# Patient Record
Sex: Female | Born: 1990 | Race: White | Hispanic: No | Marital: Single | Smoking: Former smoker
Health system: Southern US, Community
[De-identification: ages and names within clinical notes are randomized; demographics above are authoritative.]

## PROBLEM LIST (undated history)

## (undated) DIAGNOSIS — E079 Disorder of thyroid, unspecified: Secondary | ICD-10-CM

## (undated) DIAGNOSIS — I959 Hypotension, unspecified: Secondary | ICD-10-CM

## (undated) DIAGNOSIS — Q8740 Marfan's syndrome, unspecified: Secondary | ICD-10-CM

## (undated) DIAGNOSIS — Q796 Ehlers-Danlos syndrome, unspecified: Secondary | ICD-10-CM

## (undated) DIAGNOSIS — D649 Anemia, unspecified: Secondary | ICD-10-CM

## (undated) DIAGNOSIS — I340 Nonrheumatic mitral (valve) insufficiency: Secondary | ICD-10-CM

## (undated) DIAGNOSIS — F419 Anxiety disorder, unspecified: Secondary | ICD-10-CM

## (undated) DIAGNOSIS — F329 Major depressive disorder, single episode, unspecified: Secondary | ICD-10-CM

## (undated) DIAGNOSIS — F32A Depression, unspecified: Secondary | ICD-10-CM

## (undated) HISTORY — DX: Anemia, unspecified: D64.9

## (undated) HISTORY — PX: MOLE REMOVAL: SHX2046

## (undated) HISTORY — DX: Major depressive disorder, single episode, unspecified: F32.9

## (undated) HISTORY — DX: Ehlers-Danlos syndrome, unspecified: Q79.60

## (undated) HISTORY — DX: Hypotension, unspecified: I95.9

## (undated) HISTORY — DX: Depression, unspecified: F32.A

## (undated) HISTORY — DX: Nonrheumatic mitral (valve) insufficiency: I34.0

## (undated) HISTORY — DX: Anxiety disorder, unspecified: F41.9

---

## 2014-08-17 ENCOUNTER — Ambulatory Visit (INDEPENDENT_AMBULATORY_CARE_PROVIDER_SITE_OTHER): Payer: No Typology Code available for payment source

## 2014-08-17 ENCOUNTER — Ambulatory Visit (INDEPENDENT_AMBULATORY_CARE_PROVIDER_SITE_OTHER): Payer: No Typology Code available for payment source | Admitting: Family Medicine

## 2014-08-17 VITALS — BP 110/70 | HR 104 | Temp 98.3°F | Resp 18 | Ht 65.0 in | Wt 121.0 lb

## 2014-08-17 DIAGNOSIS — D649 Anemia, unspecified: Secondary | ICD-10-CM

## 2014-08-17 DIAGNOSIS — J0100 Acute maxillary sinusitis, unspecified: Secondary | ICD-10-CM

## 2014-08-17 DIAGNOSIS — R509 Fever, unspecified: Secondary | ICD-10-CM

## 2014-08-17 DIAGNOSIS — R059 Cough, unspecified: Secondary | ICD-10-CM

## 2014-08-17 DIAGNOSIS — H66003 Acute suppurative otitis media without spontaneous rupture of ear drum, bilateral: Secondary | ICD-10-CM

## 2014-08-17 DIAGNOSIS — R05 Cough: Secondary | ICD-10-CM

## 2014-08-17 LAB — POCT CBC
Granulocyte percent: 75.8 %G (ref 37–80)
HEMATOCRIT: 36.3 % — AB (ref 37.7–47.9)
Hemoglobin: 12 g/dL — AB (ref 12.2–16.2)
LYMPH, POC: 1.5 (ref 0.6–3.4)
MCH: 29.1 pg (ref 27–31.2)
MCHC: 33.1 g/dL (ref 31.8–35.4)
MCV: 87.7 fL (ref 80–97)
MID (cbc): 0.5 (ref 0–0.9)
MPV: 6.8 fL (ref 0–99.8)
POC Granulocyte: 6.3 (ref 2–6.9)
POC LYMPH PERCENT: 17.9 %L (ref 10–50)
POC MID %: 6.3 %M (ref 0–12)
Platelet Count, POC: 274 10*3/uL (ref 142–424)
RBC: 4.14 M/uL (ref 4.04–5.48)
RDW, POC: 13.1 %
WBC: 8.3 10*3/uL (ref 4.6–10.2)

## 2014-08-17 LAB — POCT URINE PREGNANCY: PREG TEST UR: NEGATIVE

## 2014-08-17 MED ORDER — FLUTICASONE PROPIONATE 50 MCG/ACT NA SUSP: 2.0000 | Freq: Every day | NASAL | Status: DC

## 2014-08-17 MED ORDER — CEFDINIR 300 MG PO CAPS: 600.0000 mg | ORAL_CAPSULE | Freq: Every day | ORAL | Status: DC

## 2014-08-17 MED ORDER — BENZONATATE 100 MG PO CAPS: 100.0000 mg | ORAL_CAPSULE | Freq: Three times a day (TID) | ORAL | Status: DC | PRN

## 2014-08-17 MED ORDER — HYDROCODONE-HOMATROPINE 5-1.5 MG/5ML PO SYRP: 5.0000 mL | ORAL_SOLUTION | ORAL | Status: DC | PRN

## 2014-08-17 NOTE — Patient Instructions (Signed)
Drink plenty of fluids and get enough rest  Take Omnicef 1 twice daily for antibiotic (cefdinir)  Use the fluticasone nose spray 2 sprays each nostril twice daily for 3 or 4 days, then once daily to help open up sinuses they will drain well.  Take the Hycodan cough syrup 1 teaspoon every 4-6 hours as needed for cough. This tends to be sedating and you probably will not be able to take it when you're going to work.  Take Tessalon cough pills one or 2 every 6-8 hours as needed for cough when you cannot afford to be drowsy.  Return if not improving or if worse at any time.

## 2014-08-17 NOTE — Progress Notes (Addendum)
Subjective: Patient has been feeling ill for a couple of weeks. She's had a respiratory infection. She continues to cough. She's got a lot of head pressure and congestion. She is blowing mucus out of her nose and coughing up stuff. She has a sore throat. She's been running intermittent fevers. She feels tired. She has worked intermittently but has had to take some time off. She had to go back into work some more because one of the coworkers quit. She has ear stuffiness. Her last menstrual cycle was about a month ago. She is sexually involved and not using any contraception. 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  Objective: Looks like she does not feel well. Her TMs are both mildly erythematous with some loss of contour. Her throat is mildly erythematous. Neck supple with small nodes. Chest clear to auscultation but very tight sounding when she coughs. Heart regular without murmurs. She is tender over her maxillary sinuses.  Assessment: Sinusitis Cough, rule out pneumonia  Plan: Chest x-ray and CBC. Urine hCG.  Results for orders placed or performed in visit on 08/17/14  POCT CBC  Result Value Ref Range   WBC 8.3 4.6 - 10.2 K/uL   Lymph, poc 1.5 0.6 - 3.4   POC LYMPH PERCENT 17.9 10 - 50 %L   MID (cbc) 0.5 0 - 0.9   POC MID % 6.3 0 - 12 %M   POC Granulocyte 6.3 2 - 6.9   Granulocyte percent 75.8 37 - 80 %G   RBC 4.14 4.04 - 5.48 M/uL   Hemoglobin 12.0 (A) 12.2 - 16.2 g/dL   HCT, POC 16.136.3 (A) 09.637.7 - 47.9 %   MCV 87.7 80 - 97 fL   MCH, POC 29.1 27 - 31.2 pg   MCHC 33.1 31.8 - 35.4 g/dL   RDW, POC 04.513.1 %   Platelet Count, POC 274 142 - 424 K/uL   MPV 6.8 0 - 99.8 fL  POCT urine pregnancy  Result Value Ref Range   Preg Test, Ur Negative    [facility] reading (PRIMARY) by  Dr. Alwyn RenHopper Normal chest x-ray  Will treat with Omnicef twice daily 300 mg and other symptomatic treatments  Assessment: Maxillary sinusitis Bilateral otitis Mild anemia .

## 2015-05-18 ENCOUNTER — Ambulatory Visit (INDEPENDENT_AMBULATORY_CARE_PROVIDER_SITE_OTHER): Payer: BLUE CROSS/BLUE SHIELD | Admitting: Physician Assistant

## 2015-05-18 VITALS — BP 112/64 | HR 69 | Temp 98.2°F | Resp 17 | Ht 65.0 in | Wt 129.0 lb

## 2015-05-18 DIAGNOSIS — R21 Rash and other nonspecific skin eruption: Secondary | ICD-10-CM

## 2015-05-18 LAB — POCT SKIN KOH: Skin KOH, POC: NEGATIVE

## 2015-05-18 MED ORDER — TRIAMCINOLONE ACETONIDE 0.1 % EX CREA: 1.0000 "application " | TOPICAL_CREAM | Freq: Two times a day (BID) | CUTANEOUS | Status: DC

## 2015-05-18 NOTE — Patient Instructions (Signed)
Please apply the steroid topical twice daily for 7 days.  If you are not improved with this please let us know and we will try an anti fungal topical medication.

## 2015-05-18 NOTE — Progress Notes (Signed)
   Subjective:    Patient ID: Megan [name], female    DOB: [birth year], 24 y.o.   MRN: [id number]  Chief Complaint  Patient presents with  . Rash    x1 gluteal area   Medications, allergies, past medical history, surgical history, family history, social history and problem list reviewed and updated.  HPI  57 yof presents with rash.   Started about one wk ago. Intergluteal cleft. Has been slightly worsening past 2 days. Itchy. Not painful. No drainage. No new meds. Only new exposure is boyfriends body wash couple weeks ago. Denies rash anywhere else on body. Bf lives with her and denies any rash. Denies abd pain, diarrhea, vaginal dc, fevers. Rash very mildly extends to the groin area but majority intergluteal.   Denies vaginal rash. Denies dysuria.   Review of Systems See HPI     Objective:   Physical Exam  Constitutional: She is oriented to person, place, and time. She appears well-developed and well-nourished.  Non-toxic appearance. She does not have a sickly appearance. She does not appear ill. No distress.  BP 112/64 mmHg  Pulse 69  Temp(Src) 98.2 F (36.8 C) (Oral)  Resp 17  Ht  (1.651 m)  Wt 129 lb (58.514 kg)  BMI 21.47 kg/m2  SpO2 99%  LMP 04/13/2015   Neurological: She is alert and oriented to person, place, and time.  Skin:  Intergluteal cleft erythematous maculopapular rash with few satellite papules and pustules. Not beefy red. Slight scaling. No induration or fluctuance.   Psychiatric: She has a normal mood and affect. Her speech is normal and behavior is normal.   Results for orders placed or performed in visit on 05/18/15  POCT Skin KOH  Result Value Ref Range   Skin KOH, POC Negative       Assessment & Plan:   Rash and nonspecific skin eruption - Plan: POCT Skin KOH --unsure of etiology, possible exposure to new body wash --triamcinolone topical bid, contact office if not improved one wk for possible lotrimin topical for possible fungal though  koh neg today --no dysuria, pain or itching to suggest herpes, but rtc if these sx present  Donnajean Lopes, PA-C Physician Assistant-Certified [facility]  05/18/2015 5:19 PM

## 2015-06-07 IMAGING — CR DG CHEST 2V
2 series · 2 of 2 positions shown · non-contrast
Comparison: None.

CLINICAL DATA: Cough and flu symptoms for 2 weeks

EXAM:
CHEST  2 VIEW

[PA]
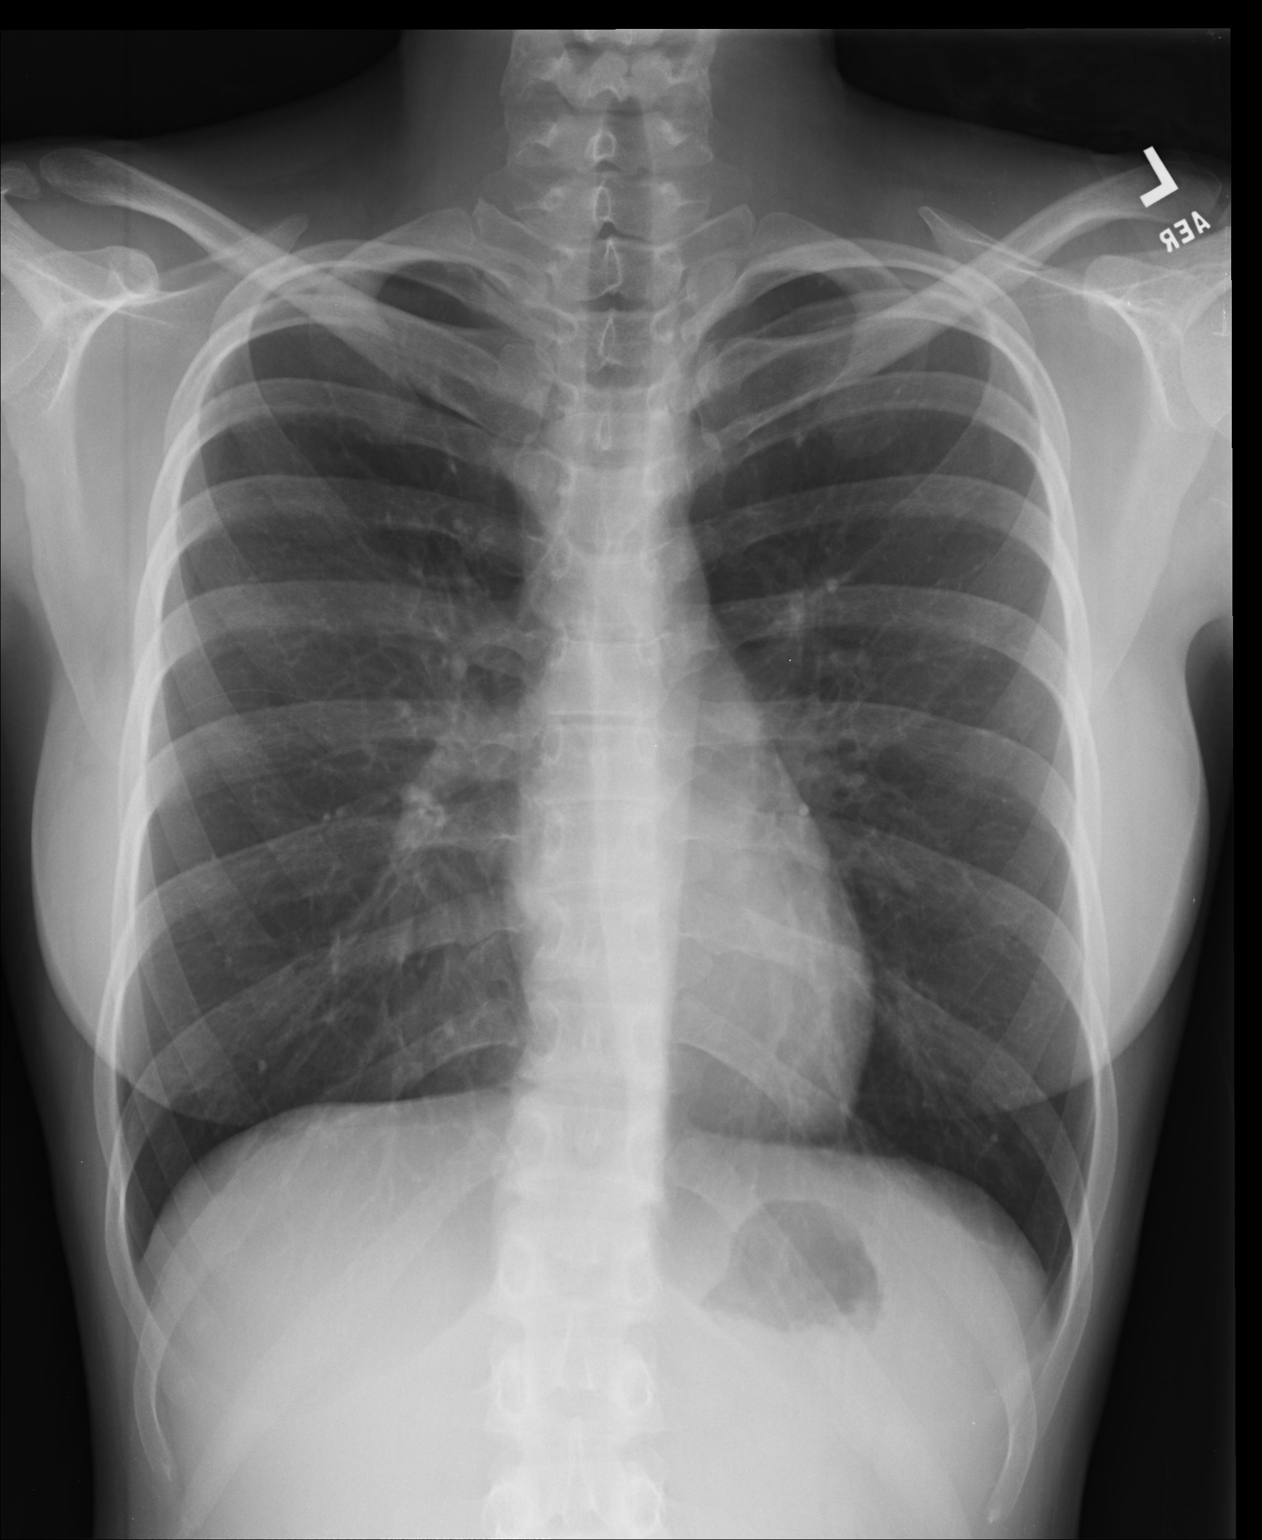

[lateral]
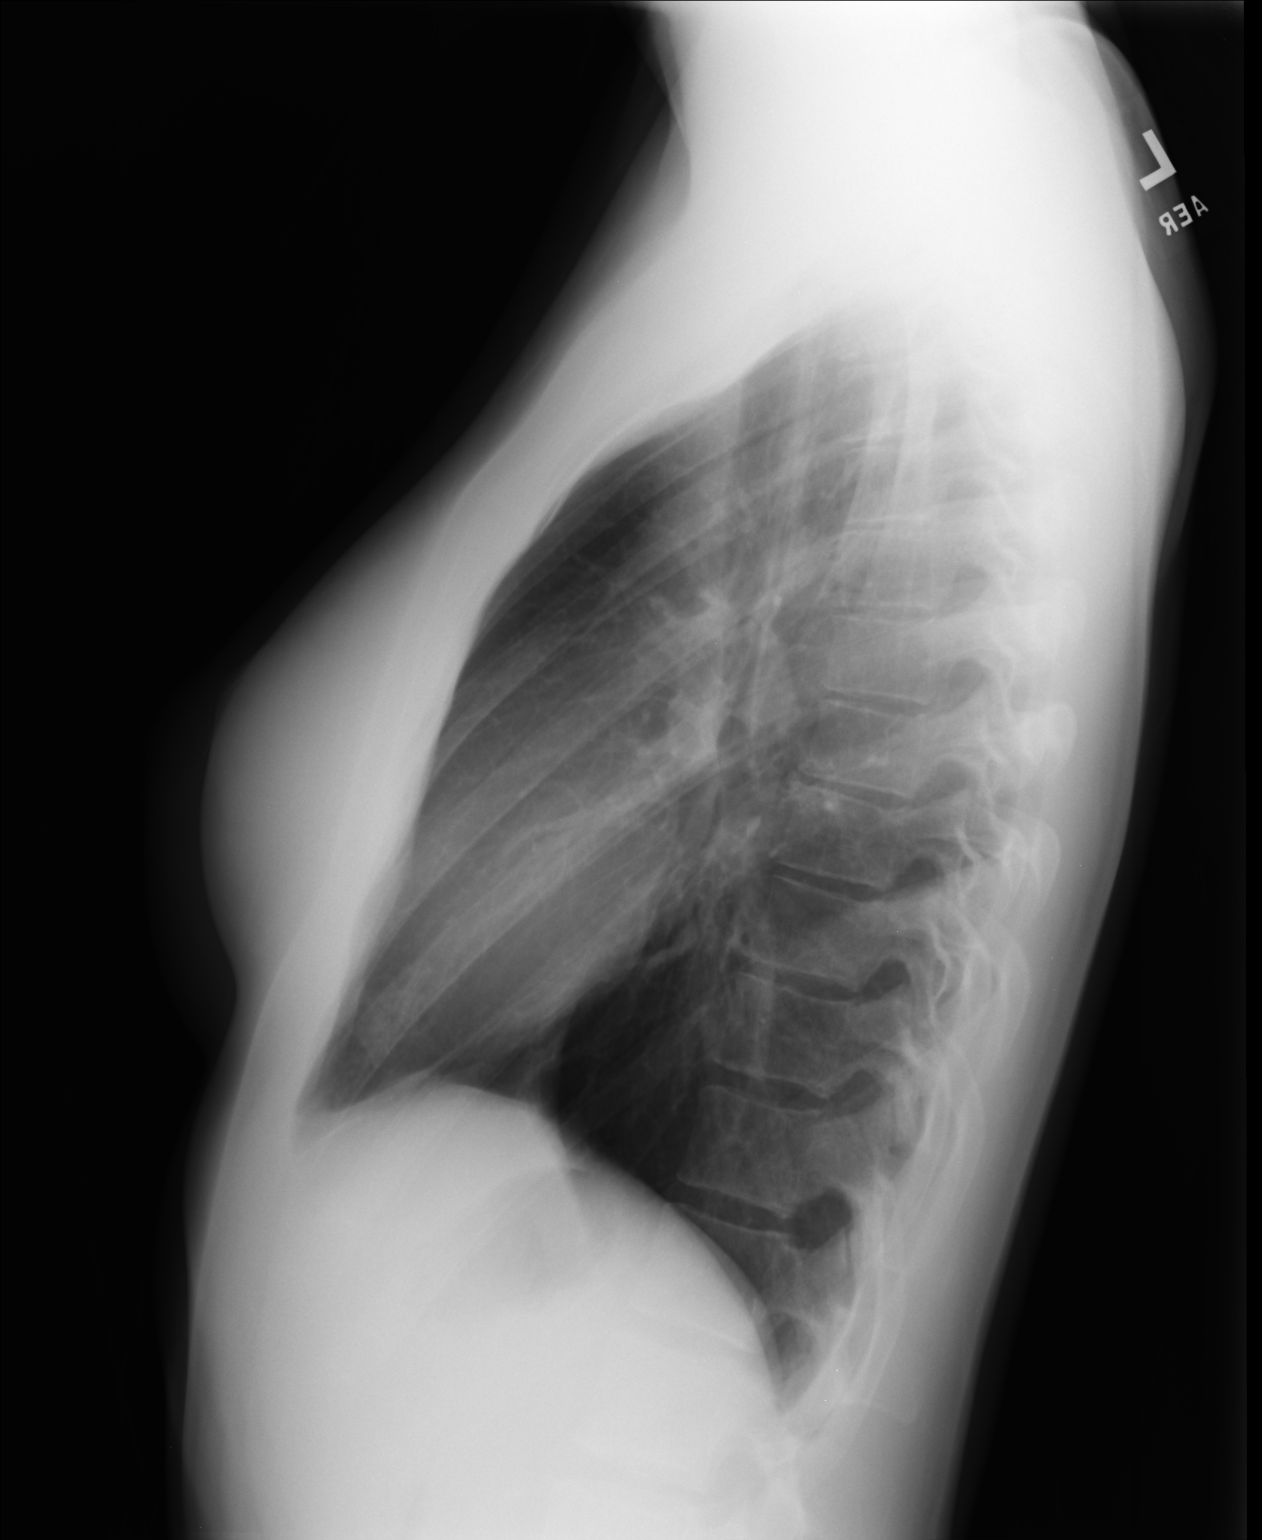

[2 of 2 positions shown; findings below may reference images not displayed]

FINDINGS: The heart size and mediastinal contours are within normal limits.
Both lungs are clear. The visualized skeletal structures are
unremarkable.
IMPRESSION: No active cardiopulmonary disease.

## 2015-09-11 ENCOUNTER — Emergency Department (HOSPITAL_COMMUNITY)
Admission: EM | Admit: 2015-09-11 | Discharge: 2015-09-12 | Disposition: A | Discharge status: Home / Self Care | Payer: BLUE CROSS/BLUE SHIELD | Attending: Emergency Medicine | Admitting: Emergency Medicine

## 2015-09-11 ENCOUNTER — Encounter (HOSPITAL_COMMUNITY): Payer: Self-pay | Admitting: *Deleted

## 2015-09-11 DIAGNOSIS — Q8740 Marfan's syndrome, unspecified: Secondary | ICD-10-CM | POA: Diagnosis not present

## 2015-09-11 DIAGNOSIS — R079 Chest pain, unspecified: Secondary | ICD-10-CM | POA: Insufficient documentation

## 2015-09-11 DIAGNOSIS — R200 Anesthesia of skin: Secondary | ICD-10-CM | POA: Insufficient documentation

## 2015-09-11 DIAGNOSIS — R000 Tachycardia, unspecified: Secondary | ICD-10-CM | POA: Insufficient documentation

## 2015-09-11 DIAGNOSIS — R42 Dizziness and giddiness: Secondary | ICD-10-CM | POA: Diagnosis not present

## 2015-09-11 DIAGNOSIS — Z8659 Personal history of other mental and behavioral disorders: Secondary | ICD-10-CM | POA: Diagnosis not present

## 2015-09-11 DIAGNOSIS — R002 Palpitations: Secondary | ICD-10-CM | POA: Insufficient documentation

## 2015-09-11 DIAGNOSIS — Z862 Personal history of diseases of the blood and blood-forming organs and certain disorders involving the immune mechanism: Secondary | ICD-10-CM | POA: Diagnosis not present

## 2015-09-11 DIAGNOSIS — R0602 Shortness of breath: Secondary | ICD-10-CM | POA: Insufficient documentation

## 2015-09-11 DIAGNOSIS — R110 Nausea: Secondary | ICD-10-CM | POA: Diagnosis not present

## 2015-09-11 DIAGNOSIS — Q796 Ehlers-Danlos syndrome, unspecified: Secondary | ICD-10-CM

## 2015-09-11 DIAGNOSIS — R202 Paresthesia of skin: Secondary | ICD-10-CM | POA: Diagnosis not present

## 2015-09-11 DIAGNOSIS — E039 Hypothyroidism, unspecified: Secondary | ICD-10-CM | POA: Diagnosis not present

## 2015-09-11 DIAGNOSIS — Z91040 Latex allergy status: Secondary | ICD-10-CM | POA: Diagnosis not present

## 2015-09-11 HISTORY — DX: Disorder of thyroid, unspecified: E07.9

## 2015-09-11 HISTORY — DX: Marfan syndrome, unspecified: Q87.40

## 2015-09-11 NOTE — ED Notes (Signed)
Pt states that she has been having chest pain off and on for a year; pt states that it has been getting worse over the last couple of months; pt states that she is having left arm numbness and tingling; pt states that this is the 2nd time in a yar that this has happened; pt also complain of feeling dizzy and nauseous with no vomiting

## 2015-09-11 NOTE — ED Notes (Signed)
MD at bedside. 

## 2015-09-11 NOTE — ED Provider Notes (Signed)
CSN: [id number]     Arrival date & time 09/11/15  2302 History  By signing my name below, I, Arianna Nassar, attest that this documentation has been prepared under the direction and in the presence of Derwood Kaplan, MD. Electronically Signed: Octavia Heir, ED Scribe. 09/11/2015. 11:59 PM.      Chief Complaint  Patient presents with  . Chest Pain      The history is provided by the patient. No language interpreter was used.   HPI Comments: Megan [name] is a 25 y.o. female who has a hx of thyroid disorder, anemia, and marfan syndrome presents to the Emergency Department complaining of intermittent, moderate, gradual worsening stabbing and aching left sided chest pain onset one year ago. Pt notes having associated nausea, palpitations, shortness of breath, and dizziness with her episodes of chest pain. She reports feeling a numb, heavy, and tingling sensation in her left arm which is something she has had for a second time this year (also has been off and of for a few months with the chest pain). Pt says that her chest pain duration varies up to 10 hours during some episodes. Pt notes having the chest pain almost every day over the past few months. Pt reports when she works out she does not notice her chest pain as much compared to when she is sitting down or ambulating. She denies diaphoresis, IV drug use, etoh abuse, back pain, recent weight loss, diarrhea, constipation, abdominal pain, significant hair loss, hx of blood clots, recent travel in the past 6 weeks, use of birth control or hormones, and vomiting.    Past Medical History  Diagnosis Date  . Anemia   . Anxiety   . Depression   . Thyroid disease   . Marfan syndrome    History reviewed. No pertinent past surgical history. Family History  Problem Relation Age of Onset  . Cancer Maternal Grandmother   . Diabetes Maternal Grandmother   . Diabetes Maternal Grandfather   . Cancer Paternal Grandmother   . Cancer Paternal  Grandfather    Social History  Substance Use Topics  . Smoking status: Never Smoker   . Smokeless tobacco: None  . Alcohol Use: 0.0 oz/week    0 Standard drinks or equivalent per week   OB History    No data available     Review of Systems  A complete 10 system review of systems was obtained and all systems are negative except as noted in the HPI and PMH.    Allergies  Asa and Latex  Home Medications   Prior to Admission medications   Medication Sig Start Date End Date Taking? Authorizing Provider  levothyroxine (SYNTHROID, LEVOTHROID) 75 MCG tablet Take 1 tablet (75 mcg total) by mouth daily before breakfast. 09/12/15   Derwood Kaplan, MD  triamcinolone cream (KENALOG) 0.1 % Apply 1 application topically 2 (two) times daily. Patient not taking: Reported on 09/11/2015 05/18/15   Raelyn Ensign, PA   Triage vitals: BP 140/85 mmHg  Pulse 113  Temp(Src) 98.4 F (36.9 C) (Oral)  Resp 18  Ht 5' 4.5" (1.638 m)  Wt 136 lb (61.689 kg)  BMI 22.99 kg/m2  SpO2 100%  LMP 08/28/2015 Physical Exam  Constitutional: She is oriented to person, place, and time. She appears well-developed and well-nourished. No distress.  HENT:  Head: Normocephalic and atraumatic.  Eyes: EOM are normal.  Neck: Normal range of motion.  No thyroid nodules or goiter appreciated  Cardiovascular: Regular rhythm and normal heart  sounds.  Tachycardia present.   No murmur heard. 2+ and equal radial pulse, 2+ and equal inguinal pulse, 1+ DP bilaterally with right stronger than left  Pulmonary/Chest: Effort normal and breath sounds normal.  Abdominal: Soft. She exhibits no distension. There is no tenderness.  Musculoskeletal: Normal range of motion. She exhibits no edema.  No unilateral calf swelling or pitting edema  Neurological: She is alert and oriented to person, place, and time.  Gross sensory exam is normal in upper extremities  Skin: Skin is warm and dry.  Psychiatric: She has a normal mood and affect.  Judgment normal.  Nursing note and vitals reviewed.   ED Course  Procedures  DIAGNOSTIC STUDIES: Oxygen Saturation is 100% on RA, normal by my interpretation.  COORDINATION OF CARE:  11:57 PM Discussed treatment plan which includes follow up with cardiologist, CXR, and lab work with pt at bedside and pt agreed to plan.  1:49 AM Pt states her chest pain is now dull and tolerable She reports that her left arm numbness and pain has gone away for now. Initial trop is neg. HR has improved - no tachycardia. Low TSH discussed. Still, the chest pain hasnt resolved - will get dimer as a screen for dissection. If dimer + or patient has persistent chest pain - will get CT-A dissection  3:45 am: Chest pain has resolved. Arm pain has resolved. Dimer is neg. HR in the 80s. Risk and benefits of CT-A discussed. Pt is comfortable with the plan for close f/u with Cards, PCP. She has been given Neuro f/u as well. She will have her pcp take over the thyroid management. The importance of close f/u discussed. The risk of marfans and ehler danlos discussed. She has been advised to return to the ER if symptoms get worse. We have advised her to not work out until seen by Cardiology.   Labs Review Labs Reviewed  BASIC METABOLIC PANEL - Abnormal; Notable for the following:    Glucose, Bld 100 (*)    All other components within normal limits  URINALYSIS, ROUTINE W REFLEX MICROSCOPIC (NOT AT [facility]) - Abnormal; Notable for the following:    Hgb urine dipstick SMALL (*)    All other components within normal limits  TSH - Abnormal; Notable for the following:    TSH 6.269 (*)    All other components within normal limits  URINE MICROSCOPIC-ADD ON - Abnormal; Notable for the following:    Squamous Epithelial / LPF 0-5 (*)    Bacteria, UA RARE (*)    All other components within normal limits  CBC WITH DIFFERENTIAL/PLATELET  TROPONIN I  URINE RAPID DRUG SCREEN, HOSP PERFORMED  PROTIME-INR  D-DIMER, QUANTITATIVE (NOT  AT [facility])  TROPONIN I    Imaging Review Dg Chest 2 View  09/12/2015  CLINICAL DATA:  [age] year old female with chest pain EXAM: CHEST  2 VIEW COMPARISON:  Chest radiograph dated 08/17/2014 FINDINGS: The heart size and mediastinal contours are within normal limits. Both lungs are clear. The visualized skeletal structures are unremarkable. IMPRESSION: No active cardiopulmonary disease. Electronically Signed   By: Elgie CollardArash  Radparvar M.D.   On: 09/12/2015 01:03   I have personally reviewed and evaluated these images and lab results as part of my medical decision-making.   EKG Interpretation None       Date: 09/12/2015  Rate: 103  Rhythm: sinus tachycardia rhythm  QRS Axis: normal  Intervals: normal  ST/T Wave abnormalities: normal  Conduction Disutrbances: none  Narrative Interpretation: unremarkable  MDM   Final diagnoses:  Chest pain, unspecified chest pain type  Hypothyroidism, unspecified hypothyroidism type  Ehlers-Danlos syndrome    I personally performed the services described in this documentation, which was scribed in my presence. The recorded information has been reviewed and is accurate.  Pt comes in with chest pain. Chest pain is L sided, dull and sharp. Pain has been intermittent for 1 year now. As of late, she has noticed the chest pain to be lasting longer when it comes. Pain has no precipitating factor. There is no specific aggravating or reliving factor - specifically, pt reports chest pain feels better when working out. There is no pleuritic component. Chest pain has been L sided - close to the shoulder, close the L lower thorac and also L anterior chest. At no point has the pain ever radiated to the back. Pt also has associated L sided heaviness, and numbness. The symptoms is associated with the chest pain. She reports that the symptom is relative new.  On exam - normal distal pulses that are equal. Also has normal heart sounds, no murmur heard.  Pt is  tachycardic however. Her neuro exam is normal - no objective deficits.  DDX: Dissection ACS Stroke Nerve impingement Aortic Aneurysm   Pt also reports having hx of hypothyroidism, she takes no meds for it. Will check TSH.  Based on the hx and the exam - the greatest concern if her past hx of ehler danlos and marfan syndrome.  We will get basic labs and reassess.  Derwood Kaplan, MD 09/12/15 [phone]

## 2015-09-12 ENCOUNTER — Emergency Department (HOSPITAL_COMMUNITY): Payer: BLUE CROSS/BLUE SHIELD

## 2015-09-12 LAB — CBC WITH DIFFERENTIAL/PLATELET
BASOS PCT: 0 %
Basophils Absolute: 0 10*3/uL (ref 0.0–0.1)
Eosinophils Absolute: 0.1 10*3/uL (ref 0.0–0.7)
Eosinophils Relative: 1 %
HEMATOCRIT: 41.2 % (ref 36.0–46.0)
HEMOGLOBIN: 13.6 g/dL (ref 12.0–15.0)
LYMPHS PCT: 23 %
Lymphs Abs: 1.6 10*3/uL (ref 0.7–4.0)
MCH: 29.6 pg (ref 26.0–34.0)
MCHC: 33 g/dL (ref 30.0–36.0)
MCV: 89.8 fL (ref 78.0–100.0)
MONOS PCT: 8 %
Monocytes Absolute: 0.6 10*3/uL (ref 0.1–1.0)
NEUTROS ABS: 4.6 10*3/uL (ref 1.7–7.7)
NEUTROS PCT: 68 %
Platelets: 238 10*3/uL (ref 150–400)
RBC: 4.59 MIL/uL (ref 3.87–5.11)
RDW: 12.6 % (ref 11.5–15.5)
WBC: 6.9 10*3/uL (ref 4.0–10.5)

## 2015-09-12 LAB — BASIC METABOLIC PANEL
ANION GAP: 10 (ref 5–15)
BUN: 17 mg/dL (ref 6–20)
CHLORIDE: 104 mmol/L (ref 101–111)
CO2: 27 mmol/L (ref 22–32)
CREATININE: 0.81 mg/dL (ref 0.44–1.00)
Calcium: 9.8 mg/dL (ref 8.9–10.3)
GFR calc non Af Amer: >60 mL/min (ref >60)
GLUCOSE: 100 mg/dL — AB (ref 65–99)
Potassium: 4 mmol/L (ref 3.5–5.1)
Sodium: 141 mmol/L (ref 135–145)

## 2015-09-12 LAB — URINALYSIS, ROUTINE W REFLEX MICROSCOPIC
Bilirubin Urine: NEGATIVE
Glucose, UA: NEGATIVE mg/dL
Ketones, ur: NEGATIVE mg/dL
Leukocytes, UA: NEGATIVE
NITRITE: NEGATIVE
Protein, ur: NEGATIVE mg/dL
SPECIFIC GRAVITY, URINE: 1.007 (ref 1.005–1.030)
pH: 6 (ref 5.0–8.0)

## 2015-09-12 LAB — PROTIME-INR
INR: 1.01 (ref 0.00–1.49)
Prothrombin Time: 13.5 seconds (ref 11.6–15.2)

## 2015-09-12 LAB — TROPONIN I: Troponin I: <0.03 ng/mL (ref <0.031)

## 2015-09-12 LAB — RAPID URINE DRUG SCREEN, HOSP PERFORMED
AMPHETAMINES: NOT DETECTED
Barbiturates: NOT DETECTED
Benzodiazepines: NOT DETECTED
COCAINE: NOT DETECTED
OPIATES: NOT DETECTED
TETRAHYDROCANNABINOL: NOT DETECTED

## 2015-09-12 LAB — D-DIMER, QUANTITATIVE: D-Dimer, Quant: <0.27 ug/mL-FEU (ref 0.00–0.50)

## 2015-09-12 LAB — URINE MICROSCOPIC-ADD ON

## 2015-09-12 LAB — TSH: TSH: 6.269 u[IU]/mL — ABNORMAL HIGH (ref 0.350–4.500)

## 2015-09-12 MED ORDER — SODIUM CHLORIDE 0.9 % IV BOLUS (SEPSIS)
1000.0000 mL | Freq: Once | INTRAVENOUS | Status: AC
  Administered 2015-09-12: 1000 mL via INTRAVENOUS

## 2015-09-12 MED ORDER — LEVOTHYROXINE SODIUM 75 MCG PO TABS: 75.0000 ug | ORAL_TABLET | Freq: Every day | ORAL | Status: DC

## 2015-09-12 NOTE — ED Notes (Signed)
Called lab and they stated they will add on the D-dimer lab test to the previously drawn blood.

## 2015-09-12 NOTE — Discharge Instructions (Signed)
We saw you in the ER for the chest pain/arm pain. All of our cardiac workup is normal, including labs, EKG and chest X-RAY are normal. We are not sure what is causing your discomfort, but we feel comfortable sending you home at this time.  The workup in the ER is not complete, and you should follow up with your primary care doctor for further evaluation.  SEE THE PRIMARY CARE DOCTOR FOR THYROID -  We are starting you on the synthroid, it will need adjustment, and if you need endocrine referral, they can provide that to you. SEE THE CARDIOLOGIST for the chest pain. SEE THE NEUROLOGIST for the arm heaviness/numbness.  Please return to the ER if you have worsening chest pain, shortness of breath, pain radiating to your jaw, shoulder, or back, sweats or fainting.   Marfan Syndrome Marfan syndrome is a connective tissue disorder. Connective tissue is found throughout your body, and it helps to support your tissues and body organs. Marfan syndrome most commonly affects your bones, joints, eyes, heart, and blood vessels. Marfan syndrome can lead to serious complications that are related to the heart, blood vessels, eyes, and lungs. CAUSES Marfan syndrome is caused by a gene mutation. The gene can be passed down from your parents (inherited), or the mutation can happen on its own. RISK FACTORS The main risk factor for Marfan syndrome is having a family history of Marfan syndrome.  SIGNS AND SYMPTOMS Symptoms of Marfan syndrome may vary from mild to severe. Common signs and symptoms may include:  Long arms and legs.  Long, thin fingers.  Curvature of the spine (scoliosis).  Nearsightedness and other vision problems.  A chest that sticks out (pectus carinatum) or looks sunken in (pectus excavatum).  Flat feet.  Crowded teeth.  Stretch marks on the skin.  Headaches or pain in the back, leg, or abdomen (due to an enlargement of the lining of the brain or spinal cord). Severe signs and  symptoms may include:  Heart problems, especially with the large blood vessel that comes from the heart (aorta) and with the heart valves.  Eye problems, including a dislocated lens, cataracts, glaucoma, and retinal detachment.  The collapse of a lung (spontaneous pneumothorax). DIAGNOSIS Marfan syndrome may be diagnosed by:  Medical history and physical exam.  Blood tests, which often include genetic testing.  MRI and CT scans.  Eye examination. This may include a slit-lamp examination. This checks to see if your eye lenses are out of place.  Heart examination. This may include an:  Echocardiogram (ECG). This creates an image of your heart, its valves, and the largest artery that carries blood from your heart to your body (aorta).  Electrocardiogram. This checks your heart rate and rhythm. TREATMENT There is no cure for Marfan syndrome. Treatment will focus on managing your symptoms and preventing or treating any heart or vision conditions that you may have. This may include a team of health care providers. Treatment may involve:  Medicines:  To lower your blood pressure.  To reduce the strain on your heart.  To reduce your pain.  Eyeglasses, contact lenses, or eye surgery.  Watching and monitoring for any heart changes. Heart surgery may be required.  A back brace. If your scoliosis is severe, you may need surgery.  Surgery to correct the formation of your chest.  Placing a tube in your chest or doing surgery to treat a collapsed lung. HOME CARE INSTRUCTIONS  Take medicines only as directed by your health care  provider.  Keep all follow-up visits as directed by your health care provider. This is important.  A support group for Marfan syndrome may help you to cope with any stress or issues that are related to your condition. Ask your health care provider for more information.  Before you have any medical or dental procedure done, tell all of your health care  providers including dentists if you have had heart valve surgery.  Do not use any tobacco products, including cigarettes, chewing tobacco, or electronic cigarettes. If you need help quitting, ask your health care provider.  If you are planning on getting pregnant, talk with your health care provider.  Exercise as directed and as allowed by your health care provider. Do not participate in high-risk or contact sports or strenuous activities, such as football or soccer.   Wear your back brace, if necessary, as directed by your health care provider.  Wear supportive shoes or inserts for your feet if directed by your health care provider. SEEK MEDICAL CARE IF:  You have new or worsening symptoms.  Your legs are numb or painful.  You have a headache.  You have fatigue.  You are snoring more than usual, or you are having difficulty sleeping. SEEK IMMEDIATE MEDICAL CARE IF:  You have sudden or severe pain in your chest, back, or abdomen.  You have trouble breathing or have shortness of breath.  You pass out.  You have changes in your vision, such as bright flashes, blurred vision, or blindness.  Your heart is beating rapidly or unevenly, or you have heart palpitations.  You have sudden pain on one side of your body. These symptoms may represent a serious problem that is an emergency. Do not wait to see if the symptoms will go away. Get medical help right away. Call your local emergency services (911 in the [place] Do not drive yourself to the hospital.   This information is not intended to replace advice given to you by your health care provider. Make sure you discuss any questions you have with your health care provider.   Document Released: 11/24/2000 Document Revised: 09/08/2014 Document Reviewed: 03/29/2014 Elsevier Interactive Patient Education 2016 [employer].     Hypothyroidism Hypothyroidism is a disorder of the thyroid. The thyroid is a large gland that is located in  the lower front of the neck. The thyroid releases hormones that control how the body works. With hypothyroidism, the thyroid does not make enough of these hormones. CAUSES Causes of hypothyroidism may include:  Viral infections.  Pregnancy.  Your own defense system (immune system) attacking your thyroid.  Certain medicines.  Birth defects.  Past radiation treatments to your head or neck.  Past treatment with radioactive iodine.  Past surgical removal of part or all of your thyroid.  Problems with the gland that is located in the center of your brain (pituitary). SIGNS AND SYMPTOMS Signs and symptoms of hypothyroidism may include:  Feeling as though you have no energy (lethargy).  Inability to tolerate cold.  Weight gain that is not explained by a change in diet or exercise habits.  Dry skin.  Coarse hair.  Menstrual irregularity.  Slowing of thought processes.  Constipation.  Sadness or depression. DIAGNOSIS  Your health care provider may diagnose hypothyroidism with blood tests and ultrasound tests. TREATMENT Hypothyroidism is treated with medicine that replaces the hormones that your body does not make. After you begin treatment, it may take several weeks for symptoms to go away. HOME CARE INSTRUCTIONS  Take medicines only as directed by your health care provider.  If you start taking any new medicines, tell your health care provider.  Keep all follow-up visits as directed by your health care provider. This is important. As your condition improves, your dosage needs may change. You will need to have blood tests regularly so that your health care provider can watch your condition. SEEK MEDICAL CARE IF:  Your symptoms do not get better with treatment.  You are taking thyroid replacement medicine and:  You sweat excessively.  You have tremors.  You feel anxious.  You lose weight rapidly.  You cannot tolerate heat.  You have emotional swings.  You  have diarrhea.  You feel weak. SEEK IMMEDIATE MEDICAL CARE IF:   You develop chest pain.  You develop an irregular heartbeat.  You develop a rapid heartbeat.   This information is not intended to replace advice given to you by your health care provider. Make sure you discuss any questions you have with your health care provider.   Document Released: 08/18/2005 Document Revised: 09/08/2014 Document Reviewed: 01/03/2014 Elsevier Interactive Patient Education [employer]

## 2015-09-12 NOTE — ED Notes (Signed)
Discharge instructions, follow up care, and rx x1 reviewed with patient. Patient verbalized understanding. 

## 2015-09-12 NOTE — ED Notes (Signed)
Patient transported to X-ray 

## 2015-09-12 NOTE — ED Notes (Signed)
Patient made aware that urine sample is needed. Patient states she cannot void at this time. Patient encouraged to inform staff when she feels she is able to void.

## 2015-09-12 NOTE — ED Notes (Signed)
MD at bedside. 

## 2015-09-13 ENCOUNTER — Encounter: Payer: Self-pay | Admitting: Neurology

## 2015-09-13 ENCOUNTER — Ambulatory Visit (INDEPENDENT_AMBULATORY_CARE_PROVIDER_SITE_OTHER): Payer: BLUE CROSS/BLUE SHIELD | Admitting: Neurology

## 2015-09-13 VITALS — BP 98/60 | HR 62 | Resp 14 | Ht 64.5 in | Wt 133.0 lb

## 2015-09-13 DIAGNOSIS — R200 Anesthesia of skin: Secondary | ICD-10-CM

## 2015-09-13 DIAGNOSIS — R202 Paresthesia of skin: Principal | ICD-10-CM
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
## 2015-09-13 NOTE — Patient Instructions (Signed)
  Your neurological exam looks good today.   We will check blood work today and call you with the test results.  Go ahead and start your thyroid medication and establish care with a PCP.   We may consider a doing an EMG and nerve conduction velocity test, which is an electrical nerve and muscle test, in the near future, if needed.   Let's talk again at your next appointment.

## 2015-09-17 ENCOUNTER — Telehealth: Payer: Self-pay

## 2015-09-17 LAB — B12 AND FOLATE PANEL
Folate: 13.7 ng/mL (ref >3.0)
Vitamin B-12: 483 pg/mL (ref 211–946)

## 2015-09-17 LAB — VITAMIN B6: VITAMIN B6: 48.5 ug/L — AB (ref 2.0–32.8)

## 2015-09-17 LAB — CK: Total CK: 64 U/L (ref 24–173)

## 2015-09-17 LAB — VITAMIN B1: THIAMINE: 151.8 nmol/L (ref 66.5–200.0)

## 2015-09-17 LAB — HOMOCYSTEINE: HOMOCYSTEINE: 6.2 umol/L (ref 0.0–15.0)

## 2015-09-17 NOTE — Progress Notes (Signed)
Quick Note:  Please call patient, labs are normal, including vitamin B1, vitamin B12, folate, muscle enzyme called CK. Vitamin B6 was a little elevated, if she is taking a vitamin B6 supplement I would recommend that she stop it. If she is taking a multivitamin with vitamin B in it, she may want to change to a different brand. No other action is required at this time. Huston FoleySaima Glada Wickstrom, MD, PhD [facility] ([facility])  ______

## 2015-09-17 NOTE — Telephone Encounter (Signed)
-----   Message from Huston FoleySaima Athar, MD sent at 09/17/2015  3:40 PM EST ----- Please call patient, labs are normal, including vitamin B1, vitamin B12, folate, muscle enzyme called CK. Vitamin B6 was a little elevated, if she is taking a vitamin B6 supplement I would recommend that she stop it. If she is taking a multivitamin with vitamin B in it, she may want to change to a different brand. No other action is required at this time. Huston FoleySaima Athar, MD, PhD [facility])

## 2015-09-17 NOTE — Telephone Encounter (Signed)
I spoke to patient and she is aware of results. She is not taking any kind of supplement.

## 2015-10-02 DIAGNOSIS — E079 Disorder of thyroid, unspecified: Secondary | ICD-10-CM | POA: Insufficient documentation

## 2015-10-02 DIAGNOSIS — F32A Depression, unspecified: Secondary | ICD-10-CM | POA: Insufficient documentation

## 2015-10-02 DIAGNOSIS — F419 Anxiety disorder, unspecified: Secondary | ICD-10-CM | POA: Insufficient documentation

## 2015-10-02 DIAGNOSIS — I959 Hypotension, unspecified: Secondary | ICD-10-CM | POA: Insufficient documentation

## 2015-10-02 DIAGNOSIS — Q796 Ehlers-Danlos syndrome, unspecified: Secondary | ICD-10-CM | POA: Insufficient documentation

## 2015-10-02 DIAGNOSIS — F329 Major depressive disorder, single episode, unspecified: Secondary | ICD-10-CM | POA: Insufficient documentation

## 2015-10-02 DIAGNOSIS — D649 Anemia, unspecified: Secondary | ICD-10-CM | POA: Insufficient documentation

## 2015-10-02 DIAGNOSIS — Q8740 Marfan's syndrome, unspecified: Secondary | ICD-10-CM | POA: Insufficient documentation

## 2015-10-03 ENCOUNTER — Encounter: Payer: Self-pay | Admitting: Cardiovascular Disease

## 2015-10-03 ENCOUNTER — Ambulatory Visit (INDEPENDENT_AMBULATORY_CARE_PROVIDER_SITE_OTHER): Payer: BLUE CROSS/BLUE SHIELD | Admitting: Cardiovascular Disease

## 2015-10-03 VITALS — BP 100/70 | HR 80 | Ht 64.5 in | Wt 136.4 lb

## 2015-10-03 DIAGNOSIS — R079 Chest pain, unspecified: Secondary | ICD-10-CM | POA: Diagnosis not present

## 2015-10-03 DIAGNOSIS — Q8740 Marfan's syndrome, unspecified: Secondary | ICD-10-CM

## 2015-10-03 NOTE — Patient Instructions (Signed)
Medication Instructions:  Your physician recommends that you continue on your current medications as directed. Please refer to the Current Medication list given to you today.   Labwork: None Ordered   Testing/Procedures: Your physician has requested that you have an echocardiogram. Echocardiography is a painless test that uses sound waves to create images of your heart. It provides your doctor with information about the size and shape of your heart and how well your heart's chambers and valves are working. This procedure takes approximately one hour. There are no restrictions for this procedure.   Follow-Up: Your physician recommends that you schedule a follow-up appointment in: late April or early May with Dr. Elease Hashimoto.    If you need a refill on your cardiac medications before your next appointment, please call your pharmacy.   Thank you for choosing [facility]! Eligha Bridegroom, RN [phone]

## 2015-10-03 NOTE — Progress Notes (Signed)
Cardiology Office Note   Date:  10/03/2015   ID:  Megan [name], DOB April 12, 1991, MRN [id number]  PCP:  No PCP Per Patient  Cardiologist:   Vesta Mixer, MD   Chief Complaint  Patient presents with  . Marfan Syndrome   Problem List 1. Marfans Syndrome 2. Chest pain  3. hyperflexability    History of Present Illness: Megan [name] is a 25 y.o. female who presents for further evaluation of her Marfan Syndrome  Was told around age 89 -86 that she had Marfans syndrome.     Has had some chest pain  Was seen in the ER. The pain is worse with moving or taking a deep breath Lasts only a split seconds - like " a string bean pluck"  Used to play sports in high school ,  Not as active. Is an Teacher, English as a foreign language ,   She is a Office manager in the Animal nutritionist at [employer] .  Works on Media planner sites during [employer].   Past Medical History  Diagnosis Date  . Anemia   . Anxiety   . Depression   . Thyroid disease   . Marfan syndrome   . Low blood pressure   . Ehlers-Danlos disease     Past Surgical History  Procedure Laterality Date  . No past surgeries       Current Outpatient Prescriptions  Medication Sig Dispense Refill  . levothyroxine (SYNTHROID, LEVOTHROID) 75 MCG tablet Take 1 tablet (75 mcg total) by mouth daily before breakfast. 30 tablet 1   No current facility-administered medications for this visit.    Allergies:   Asa and Latex    Social History:  The patient  reports that she has quit smoking. She does not have any smokeless tobacco history on file. She reports that she drinks alcohol. She reports that she does not use illicit drugs.   Family History:  The patient's family history includes Cancer in her maternal grandmother, paternal grandfather, and paternal grandmother; Diabetes in her maternal grandfather and maternal grandmother.    ROS:  Please see the history of present illness.    Review of Systems: Constitutional:  denies fever, chills, diaphoresis,  appetite change and fatigue.  HEENT: denies photophobia, eye pain, redness, hearing loss, ear pain, congestion, sore throat, rhinorrhea, sneezing, neck pain, neck stiffness and tinnitus.  Respiratory: denies SOB, DOE, cough, chest tightness, and wheezing.  Cardiovascular: admits to chest pain,  She denies  palpitations and leg swelling.  Gastrointestinal: denies nausea, vomiting, abdominal pain, diarrhea, constipation, blood in stool.  Genitourinary: denies dysuria, urgency, frequency, hematuria, flank pain and difficulty urinating.  Musculoskeletal: denies  myalgias, back pain, joint swelling, arthralgias and gait problem.   Skin: denies pallor, rash and wound.  Neurological: denies dizziness, seizures, syncope, weakness, light-headedness, numbness and headaches.   Hematological: denies adenopathy, easy bruising, personal or family bleeding history.  Psychiatric/ Behavioral: denies suicidal ideation, mood changes, confusion, nervousness, sleep disturbance and agitation.       All other systems are reviewed and negative.    PHYSICAL EXAM: VS:  BP 100/70 mmHg  Pulse 80  Ht 5' 4.5" (1.638 m)  Wt 136 lb 6.4 oz (61.871 kg)  BMI 23.06 kg/m2  LMP 08/28/2015 , BMI Body mass index is 23.06 kg/(m^2). GEN: Well nourished, well developed, in no acute distress HEENT: normal Neck: no JVD, carotid bruits, or masses Cardiac: RRR; very soft systolic murmur , no  rubs, or gallops,no edema  Respiratory:  clear to auscultation bilaterally, normal work  of breathing GI: soft, nontender, nondistended, + BS MS: no deformity or atrophy Skin: warm and dry, no rash Neuro:  Strength and sensation are intact Psych: normal   EKG:  EKG is not ordered today. The ekg ordered Jan. 12, 2017  demonstrates sinus arrhythmia.   Recent Labs: 09/12/2015: BUN 17; Creatinine, Ser 0.81; Hemoglobin 13.6; Platelets 238; Potassium 4.0; Sodium 141; TSH 6.269*    Lipid Panel No results found for: CHOL, TRIG, HDL,  CHOLHDL, VLDL, LDLCALC, LDLDIRECT    Wt Readings from Last 3 Encounters:  10/03/15 136 lb 6.4 oz (61.871 kg)  09/13/15 133 lb (60.328 kg)  09/11/15 136 lb (61.689 kg)      Other studies Reviewed: Additional studies/ records that were reviewed today include: . Review of the above records demonstrates:    ASSESSMENT AND PLAN:  1.   Marfan syndrome  She carries the diagnosis of Marfan syndrome.   Ehlers-Danlos disease is also listed as a diagnosis.    Both are associates with aortic root dilitation .   She's not all that told and she does not have other characteristics of typical Marfan syndrome. She is nearsighted but not all that severe. She does have some  hyperflexibility but again not as much as we typically see with Marfan syndrome. We'll get an echocardiogram for further evaluation of her heart and to make sure that she does not have valvular abnormalities or an aortic root abnormality. I'll see her in several months for follow-up of her chest discomfort.   Her chest x-ray does not show a widened mediastinum -there is no evidence of an aortic aneurysm or dissection.   Current medicines are reviewed at length with the patient today.  The patient does not have concerns regarding medicines.  The following changes have been made:  no change  Labs/ tests ordered today include:  No orders of the defined types were placed in this encounter.     Disposition:   FU with me in several months to re-evaluate her atypical CP.       Roddy Bellamy, Deloris Ping, MD  10/03/2015 10:14 AM    [facility] [address] [place], [place], [place]  [address] Phone: [phone]; Fax: [phone]   [facility]  [address] [place], [place]  [address] [phone]   Fax [phone]

## 2015-10-08 ENCOUNTER — Encounter: Payer: Self-pay | Admitting: Family

## 2015-10-08 ENCOUNTER — Telehealth: Payer: Self-pay | Admitting: Obstetrics and Gynecology

## 2015-10-08 ENCOUNTER — Ambulatory Visit (INDEPENDENT_AMBULATORY_CARE_PROVIDER_SITE_OTHER): Payer: BLUE CROSS/BLUE SHIELD | Admitting: Family

## 2015-10-08 VITALS — BP 112/64 | HR 65 | Temp 98.0°F | Resp 16 | Ht 64.5 in | Wt 136.8 lb

## 2015-10-08 DIAGNOSIS — Z01419 Encounter for gynecological examination (general) (routine) without abnormal findings: Secondary | ICD-10-CM

## 2015-10-08 DIAGNOSIS — Q8740 Marfan's syndrome, unspecified: Secondary | ICD-10-CM

## 2015-10-08 DIAGNOSIS — E079 Disorder of thyroid, unspecified: Secondary | ICD-10-CM

## 2015-10-08 DIAGNOSIS — Z0000 Encounter for general adult medical examination without abnormal findings: Secondary | ICD-10-CM | POA: Insufficient documentation

## 2015-10-08 NOTE — Patient Instructions (Addendum)
Thank you for choosing [facility].  Summary/Instructions:  Please continue to take your levothyroxine as prescribed.  Follow up in 2 weeks for blood work and adjustment of your medication as needed.  No appointment is needed. Please come fasting - at least 6-8 hours after last meal or first thing in the morning.  They will call you with an appointment for gynecology for your well woman exam.   Please stop by the lab on the basement level of the building for your blood work. Your results will be released to [place] (or called to you) after review, usually within 72 hours after test completion. If any changes need to be made, you will be notified at that same time.  If your symptoms worsen or fail to improve, please contact our office for further instruction, or in case of emergency go directly to the emergency room at the closest medical facility.   Health Maintenance, Female Adopting a healthy lifestyle and getting preventive care can go a long way to promote health and wellness. Talk with your health care provider about what schedule of regular examinations is right for you. This is a good chance for you to check in with your provider about disease prevention and staying healthy. In between checkups, there are plenty of things you can do on your own. Experts have done a lot of research about which lifestyle changes and preventive measures are most likely to keep you healthy. Ask your health care provider for more information. WEIGHT AND DIET  Eat a healthy diet  Be sure to include plenty of vegetables, fruits, low-fat dairy products, and lean protein.  Do not eat a lot of foods high in solid fats, added sugars, or salt.  Get regular exercise. This is one of the most important things you can do for your health.  Most adults should exercise for at least 150 minutes each week. The exercise should increase your heart rate and make you sweat (moderate-intensity exercise).  Most  adults should also do strengthening exercises at least twice a week. This is in addition to the moderate-intensity exercise.  Maintain a healthy weight  Body mass index (BMI) is a measurement that can be used to identify possible weight problems. It estimates body fat based on height and weight. Your health care provider can help determine your BMI and help you achieve or maintain a healthy weight.  For females 68 years of age and older:   A BMI below 18.5 is considered underweight.  A BMI of 18.5 to 24.9 is normal.  A BMI of 25 to 29.9 is considered overweight.  A BMI of 30 and above is considered obese.  Watch levels of cholesterol and blood lipids  You should start having your blood tested for lipids and cholesterol at 24 years of age, then have this test every 5 years.  You may need to have your cholesterol levels checked more often if:  Your lipid or cholesterol levels are high.  You are older than 24 years of age.  You are at high risk for heart disease.  CANCER SCREENING   Lung Cancer  Lung cancer screening is recommended for adults 59-46 years old who are at high risk for lung cancer because of a history of smoking.  A yearly low-dose CT scan of the lungs is recommended for people who:  Currently smoke.  Have quit within the past 15 years.  Have at least a 30-pack-year history of smoking. A pack year is smoking an average  of one pack of cigarettes a day for 1 year.  Yearly screening should continue until it has been 15 years since you quit.  Yearly screening should stop if you develop a health problem that would prevent you from having lung cancer treatment.  Breast Cancer  Practice breast self-awareness. This means understanding how your breasts normally appear and feel.  It also means doing regular breast self-exams. Let your health care provider know about any changes, no matter how small.  If you are in your 20s or 30s, you should have a clinical  breast exam (CBE) by a health care provider every 1-3 years as part of a regular health exam.  If you are 60 or older, have a CBE every year. Also consider having a breast X-ray (mammogram) every year.  If you have a family history of breast cancer, talk to your health care provider about genetic screening.  If you are at high risk for breast cancer, talk to your health care provider about having an MRI and a mammogram every year.  Breast cancer gene (BRCA) assessment is recommended for women who have family members with BRCA-related cancers. BRCA-related cancers include:  Breast.  Ovarian.  Tubal.  Peritoneal cancers.  Results of the assessment will determine the need for genetic counseling and BRCA1 and BRCA2 testing. Cervical Cancer Your health care provider may recommend that you be screened regularly for cancer of the pelvic organs (ovaries, uterus, and vagina). This screening involves a pelvic examination, including checking for microscopic changes to the surface of your cervix (Pap test). You may be encouraged to have this screening done every 3 years, beginning at age 83.  For women ages 72-65, health care providers may recommend pelvic exams and Pap testing every 3 years, or they may recommend the Pap and pelvic exam, combined with testing for human papilloma virus (HPV), every 5 years. Some types of HPV increase your risk of cervical cancer. Testing for HPV may also be done on women of any age with unclear Pap test results.  Other health care providers may not recommend any screening for nonpregnant women who are considered low risk for pelvic cancer and who do not have symptoms. Ask your health care provider if a screening pelvic exam is right for you.  If you have had past treatment for cervical cancer or a condition that could lead to cancer, you need Pap tests and screening for cancer for at least 20 years after your treatment. If Pap tests have been discontinued, your risk  factors (such as having a new sexual partner) need to be reassessed to determine if screening should resume. Some women have medical problems that increase the chance of getting cervical cancer. In these cases, your health care provider may recommend more frequent screening and Pap tests. Colorectal Cancer  This type of cancer can be detected and often prevented.  Routine colorectal cancer screening usually begins at 24 years of age and continues through 24 years of age.  Your health care provider may recommend screening at an earlier age if you have risk factors for colon cancer.  Your health care provider may also recommend using home test kits to check for hidden blood in the stool.  A small camera at the end of a tube can be used to examine your colon directly (sigmoidoscopy or colonoscopy). This is done to check for the earliest forms of colorectal cancer.  Routine screening usually begins at age 58.  Direct examination of the colon should be  repeated every 5-10 years through 24 years of age. However, you may need to be screened more often if early forms of precancerous polyps or small growths are found. Skin Cancer  Check your skin from head to toe regularly.  Tell your health care provider about any new moles or changes in moles, especially if there is a change in a mole's shape or color.  Also tell your health care provider if you have a mole that is larger than the size of a pencil eraser.  Always use sunscreen. Apply sunscreen liberally and repeatedly throughout the day.  Protect yourself by wearing long sleeves, pants, a wide-brimmed hat, and sunglasses whenever you are outside. HEART DISEASE, DIABETES, AND HIGH BLOOD PRESSURE   High blood pressure causes heart disease and increases the risk of stroke. High blood pressure is more likely to develop in:  People who have blood pressure in the high end of the normal range (130-139/85-89 mm Hg).  People who are overweight or  obese.  People who are African American.  If you are 49-103 years of age, have your blood pressure checked every 3-5 years. If you are 74 years of age or older, have your blood pressure checked every year. You should have your blood pressure measured twice--once when you are at a hospital or clinic, and once when you are not at a hospital or clinic. Record the average of the two measurements. To check your blood pressure when you are not at a hospital or clinic, you can use:  An automated blood pressure machine at a pharmacy.  A home blood pressure monitor.  If you are between 90 years and 48 years old, ask your health care provider if you should take aspirin to prevent strokes.  Have regular diabetes screenings. This involves taking a blood sample to check your fasting blood sugar level.  If you are at a normal weight and have a low risk for diabetes, have this test once every three years after 24 years of age.  If you are overweight and have a high risk for diabetes, consider being tested at a younger age or more often. PREVENTING INFECTION  Hepatitis B  If you have a higher risk for hepatitis B, you should be screened for this virus. You are considered at high risk for hepatitis B if:  You were born in a country where hepatitis B is common. Ask your health care provider which countries are considered high risk.  Your parents were born in a high-risk country, and you have not been immunized against hepatitis B (hepatitis B vaccine).  You have HIV or AIDS.  You use needles to inject street drugs.  You live with someone who has hepatitis B.  You have had sex with someone who has hepatitis B.  You get hemodialysis treatment.  You take certain medicines for conditions, including cancer, organ transplantation, and autoimmune conditions. Hepatitis C  Blood testing is recommended for:  Everyone born from [birth year] through 1964.  Anyone with known risk factors for hepatitis  C. Sexually transmitted infections (STIs)  You should be screened for sexually transmitted infections (STIs) including gonorrhea and chlamydia if:  You are sexually active and are younger than 24 years of age.  You are older than 24 years of age and your health care provider tells you that you are at risk for this type of infection.  Your sexual activity has changed since you were last screened and you are at an increased risk for chlamydia or  gonorrhea. Ask your health care provider if you are at risk.  If you do not have HIV, but are at risk, it may be recommended that you take a prescription medicine daily to prevent HIV infection. This is called pre-exposure prophylaxis (PrEP). You are considered at risk if:  You are sexually active and do not regularly use condoms or know the HIV status of your partner(s).  You take drugs by injection.  You are sexually active with a partner who has HIV. Talk with your health care provider about whether you are at high risk of being infected with HIV. If you choose to begin PrEP, you should first be tested for HIV. You should then be tested every 3 months for as long as you are taking PrEP.  PREGNANCY   If you are premenopausal and you may become pregnant, ask your health care provider about preconception counseling.  If you may become pregnant, take 400 to 800 micrograms (mcg) of folic acid every day.  If you want to prevent pregnancy, talk to your health care provider about birth control (contraception). OSTEOPOROSIS AND MENOPAUSE   Osteoporosis is a disease in which the bones lose minerals and strength with aging. This can result in serious bone fractures. Your risk for osteoporosis can be identified using a bone density scan.  If you are 96 years of age or older, or if you are at risk for osteoporosis and fractures, ask your health care provider if you should be screened.  Ask your health care provider whether you should take a calcium or  vitamin D supplement to lower your risk for osteoporosis.  Menopause may have certain physical symptoms and risks.  Hormone replacement therapy may reduce some of these symptoms and risks. Talk to your health care provider about whether hormone replacement therapy is right for you.  HOME CARE INSTRUCTIONS   Schedule regular health, dental, and eye exams.  Stay current with your immunizations.   Do not use any tobacco products including cigarettes, chewing tobacco, or electronic cigarettes.  If you are pregnant, do not drink alcohol.  If you are breastfeeding, limit how much and how often you drink alcohol.  Limit alcohol intake to no more than 1 drink per day for nonpregnant women. One drink equals 12 ounces of beer, 5 ounces of wine, or 1 ounces of hard liquor.  Do not use street drugs.  Do not share needles.  Ask your health care provider for help if you need support or information about quitting drugs.  Tell your health care provider if you often feel depressed.  Tell your health care provider if you have ever been abused or do not feel safe at home.   This information is not intended to replace advice given to you by your health care provider. Make sure you discuss any questions you have with your health care provider.   Document Released: 03/03/2011 Document Revised: 09/08/2014 Document Reviewed: 07/20/2013 Elsevier Interactive Patient Education [employer].

## 2015-10-08 NOTE — Assessment & Plan Note (Signed)
Started on levothyroxine about 4 weeks ago. Has improvements with current dose. Follow up in 2 weeks for TSH levels and changes of dosage if necessary.

## 2015-10-08 NOTE — Assessment & Plan Note (Signed)
1) Anticipatory Guidance: Discussed importance of wearing a seatbelt while driving and not texting while driving; changing batteries in smoke detector at least once annually; wearing suntan lotion when outside; eating a balanced and moderate diet; getting physical activity at least 30 minutes per day.  2) Immunizations / Screenings / Labs:  Declines flu shot. All other immunizations are up to date per recommendations. Due for well woman exam with GYN referral placed. Due for dental exam encouraged to be completed independently. All other screenings are up to date per recommendations. Obtain CBC, CMET, Lipid profile and TSH.   Overall well exam with minimal risk factors for cardiovascular disease. Marfan syndrome stable per most recent visit with cardiology. She is of good weight and has restarted exercising after her cardiology appointment. She recently cut down on caffeine which has decreased her chest pain. Recently started on levothyroxine which has improved her feelings of fatigue and sleepiness. Continue healthy lifestyle choices and behaviors. Follow-up prevention exam in 1 year. Follow-up office visit pending blood work.

## 2015-10-08 NOTE — Progress Notes (Signed)
Pre visit review using our clinic review tool, if applicable. No additional management support is needed unless otherwise documented below in the visit note. 

## 2015-10-08 NOTE — Progress Notes (Signed)
Subjective:    Patient ID: Megan [name], female    DOB: 06/05/1991, 25 y.o.   MRN: [id number]  Chief Complaint  Patient presents with  . Establish Care    CPE, Fasting    HPI:  Megan [name] is a 25 y.o. female who presents today for an annual wellness visit.   1) Health Maintenance -   Diet - Averages about 2-3 meals per day consisting of fruits, vegetables, chicken, beef pork; Occasional fast/processed foods; Caffeine intake about 1 cup per week.   Exercise -  Restarting exercise program.    2) Preventative Exams / Immunizations:  Dental -- Due for exam  Vision -- Up to date   Health Maintenance  Topic Date Due  . HIV Screening  09/23/2006  . PAP SMEAR  09/23/2012  . INFLUENZA VACCINE  05/16/2016 (Originally 04/02/2015)  . TETANUS/TDAP  10/17/2023     There is no immunization history on file for this patient.   Allergies  Allergen Reactions  . Asa [Aspirin] Other (See Comments)    *childhood allergy* reaction unknown   . Latex Itching and Rash     Outpatient Prescriptions Prior to Visit  Medication Sig Dispense Refill  . levothyroxine (SYNTHROID, LEVOTHROID) 75 MCG tablet Take 1 tablet (75 mcg total) by mouth daily before breakfast. 30 tablet 1   No facility-administered medications prior to visit.     Past Medical History  Diagnosis Date  . Anemia   . Anxiety   . Depression   . Thyroid disease   . Marfan syndrome   . Low blood pressure   . Ehlers-Danlos disease      Past Surgical History  Procedure Laterality Date  . No past surgeries       Family History  Problem Relation Age of Onset  . Cancer Maternal Grandmother   . Diabetes Maternal Grandmother   . Diabetes Maternal Grandfather   . Cancer Paternal Grandmother   . Cancer Paternal Grandfather   . Thyroid disease Mother   . Healthy Father      Social History   Social History  . Marital Status: Single    Spouse Name: N/A  . Number of Children: 0  . Years of Education:  16   Occupational History  . [employer]    Social History Main Topics  . Smoking status: Former Smoker -- 0.25 packs/day for 1 years    Types: Cigarettes  . Smokeless tobacco: Never Used     Comment: Smoked for 1 year  . Alcohol Use: 1.2 oz/week    0 Standard drinks or equivalent, 2 Cans of beer per week  . Drug Use: No  . Sexual Activity: Not on file   Other Topics Concern  . Not on file   Social History Narrative   Drinks coffee daily    Fun: Anything outside   Denies abuse and feels safe at home.     Review of Systems  Constitutional: Denies fever, chills, fatigue, or significant weight gain/loss. HENT: Head: Denies headache or neck pain Ears: Denies changes in hearing, ringing in ears, earache, drainage Nose: Denies discharge, stuffiness, itching, nosebleed, sinus pain Throat: Denies sore throat, hoarseness, dry mouth, sores, thrush Eyes: Denies loss/changes in vision, pain, redness, blurry/double vision, flashing lights Cardiovascular: Denies chest pain/discomfort, tightness, palpitations, shortness of breath with activity, difficulty lying down, swelling, sudden awakening with shortness of breath Respiratory: Denies shortness of breath, cough, sputum production, wheezing Gastrointestinal: Denies dysphasia, heartburn, change in appetite, nausea,  change in bowel habits, rectal bleeding, constipation, diarrhea, yellow skin or eyes Genitourinary: Denies frequency, urgency, burning/pain, blood in urine, incontinence, change in urinary strength. Musculoskeletal: Denies muscle/joint pain, stiffness, back pain, redness or swelling of joints, trauma Skin: Denies rashes, lumps, itching, dryness, color changes, or hair/nail changes Neurological: Denies dizziness, fainting, seizures, weakness, numbness, tingling, tremor Psychiatric - Denies nervousness, stress, depression or memory loss Endocrine: Denies heat or cold intolerance, sweating, frequent urination, excessive  thirst, changes in appetite Hematologic: Denies ease of bruising or bleeding     Objective:     BP 112/64 mmHg  Pulse 65  Temp(Src) 98 F (36.7 C) (Oral)  Resp 16  Ht 5' 4.5" (1.638 m)  Wt 136 lb 12.8 oz (62.052 kg)  BMI 23.13 kg/m2  SpO2 98%  LMP 08/28/2015 Nursing note and vital signs reviewed.  Physical Exam  Constitutional: She is oriented to person, place, and time. She appears well-developed and well-nourished.  HENT:  Head: Normocephalic.  Right Ear: Hearing, tympanic membrane, external ear and ear canal normal.  Left Ear: Hearing, tympanic membrane, external ear and ear canal normal.  Nose: Nose normal.  Mouth/Throat: Uvula is midline, oropharynx is clear and moist and mucous membranes are normal.  Eyes: Conjunctivae and EOM are normal. Pupils are equal, round, and reactive to light.  Neck: Neck supple. No JVD present. No tracheal deviation present. No thyromegaly present.  Cardiovascular: Normal rate, regular rhythm, normal heart sounds and intact distal pulses.   Pulmonary/Chest: Effort normal and breath sounds normal.  Abdominal: Soft. Bowel sounds are normal. She exhibits no distension and no mass. There is no tenderness. There is no rebound and no guarding.  Musculoskeletal: Normal range of motion. She exhibits no edema or tenderness.  Lymphadenopathy:    She has no cervical adenopathy.  Neurological: She is alert and oriented to person, place, and time. She has normal reflexes. No cranial nerve deficit. She exhibits normal muscle tone. Coordination normal.  Skin: Skin is warm and dry.  Psychiatric: She has a normal mood and affect. Her behavior is normal. Judgment and thought content normal.       Assessment & Plan:   Problem List Items Addressed This Visit      Endocrine   Thyroid disease    Started on levothyroxine about 4 weeks ago. Has improvements with current dose. Follow up in 2 weeks for TSH levels and changes of dosage if necessary.          Other   Marfan syndrome    Stable and currently managed through cardiology. Continue management of risk factors as indicated.      Routine general medical examination at a health care facility - Primary    1) Anticipatory Guidance: Discussed importance of wearing a seatbelt while driving and not texting while driving; changing batteries in smoke detector at least once annually; wearing suntan lotion when outside; eating a balanced and moderate diet; getting physical activity at least 30 minutes per day.  2) Immunizations / Screenings / Labs:  Declines flu shot. All other immunizations are up to date per recommendations. Due for well woman exam with GYN referral placed. Due for dental exam encouraged to be completed independently. All other screenings are up to date per recommendations. Obtain CBC, CMET, Lipid profile and TSH.   Overall well exam with minimal risk factors for cardiovascular disease. Marfan syndrome stable per most recent visit with cardiology. She is of good weight and has restarted exercising after her cardiology appointment. She recently  cut down on caffeine which has decreased her chest pain. Recently started on levothyroxine which has improved her feelings of fatigue and sleepiness. Continue healthy lifestyle choices and behaviors. Follow-up prevention exam in 1 year. Follow-up office visit pending blood work.       Relevant Orders   CBC   Comprehensive metabolic panel   Lipid panel   TSH    Other Visit Diagnoses    Well woman exam        Relevant Orders    Ambulatory referral to Gynecology

## 2015-10-08 NOTE — Assessment & Plan Note (Signed)
Stable and currently managed through cardiology. Continue management of risk factors as indicated.

## 2015-10-08 NOTE — Telephone Encounter (Signed)
Called and left a message for patient to call back to schedule a new patient doctor referral. °

## 2015-10-09 NOTE — Telephone Encounter (Signed)
Called and left a message for patient to call back to schedule a new patient doctor referral. °

## 2015-10-10 NOTE — Telephone Encounter (Signed)
Called and left a message for patient to call back to schedule a new patient doctor referral.  She was at work and will call back to schedule.

## 2015-10-12 NOTE — Telephone Encounter (Signed)
Patient has not returned calls to schedule. Routing referral back to referring office for follow up.

## 2015-10-15 ENCOUNTER — Ambulatory Visit (HOSPITAL_COMMUNITY): Payer: BLUE CROSS/BLUE SHIELD | Attending: Internal Medicine

## 2015-10-15 ENCOUNTER — Other Ambulatory Visit: Payer: Self-pay

## 2015-10-15 DIAGNOSIS — I340 Nonrheumatic mitral (valve) insufficiency: Secondary | ICD-10-CM | POA: Insufficient documentation

## 2015-10-15 DIAGNOSIS — Q8740 Marfan's syndrome, unspecified: Secondary | ICD-10-CM | POA: Insufficient documentation

## 2015-10-15 DIAGNOSIS — R079 Chest pain, unspecified: Secondary | ICD-10-CM | POA: Diagnosis not present

## 2015-11-05 ENCOUNTER — Encounter: Payer: Self-pay | Admitting: Neurology

## 2015-11-08 ENCOUNTER — Ambulatory Visit (INDEPENDENT_AMBULATORY_CARE_PROVIDER_SITE_OTHER): Payer: BLUE CROSS/BLUE SHIELD | Admitting: Obstetrics and Gynecology

## 2015-11-08 ENCOUNTER — Encounter: Payer: Self-pay | Admitting: Obstetrics and Gynecology

## 2015-11-08 VITALS — BP 98/60 | HR 80 | Resp 14 | Ht 64.5 in | Wt 134.0 lb

## 2015-11-08 DIAGNOSIS — B081 Molluscum contagiosum: Secondary | ICD-10-CM | POA: Diagnosis not present

## 2015-11-08 DIAGNOSIS — Z124 Encounter for screening for malignant neoplasm of cervix: Secondary | ICD-10-CM

## 2015-11-08 DIAGNOSIS — N946 Dysmenorrhea, unspecified: Secondary | ICD-10-CM

## 2015-11-08 DIAGNOSIS — Z23 Encounter for immunization: Secondary | ICD-10-CM | POA: Diagnosis not present

## 2015-11-08 DIAGNOSIS — Z113 Encounter for screening for infections with a predominantly sexual mode of transmission: Secondary | ICD-10-CM | POA: Diagnosis not present

## 2015-11-08 DIAGNOSIS — Z01419 Encounter for gynecological examination (general) (routine) without abnormal findings: Secondary | ICD-10-CM | POA: Diagnosis not present

## 2015-11-08 MED ORDER — NAPROXEN SODIUM 550 MG PO TABS: ORAL_TABLET | ORAL | Status: DC

## 2015-11-08 NOTE — Progress Notes (Signed)
Patient ID: Megan SkinnerLyndsey Schoenfeld, female   DOB: 02-23-1991, 25 y.o.   MRN: [id number] 25 y.o. G0P0000 SingleCaucasianF here for annual exam.  She has noticed some small red bumps on her inner thighs, mostly on the left, some have resolved and others have come. Partner had one random bump that came and went. They are using w/d for contraception. Declines other contraception. Aware of risks. Same partner x 1.5 years, live together.  Period Cycle (Days): 28 Period Duration (Days): 5 days  Period Pattern: Regular Menstrual Flow: Heavy, Light Menstrual Control: Maxi pad, Tampon Dysmenorrhea: (!) Moderate Dysmenorrhea Symptoms: Cramping  Saturates a super tampon in 3 hours. Cramps are helped some with ibuprofen (taking 400 mg q 4 hours). Occasional midcycle spotting.   Patient's last menstrual period was 11/04/2015.          Sexually active: Yes.    The current method of family planning is none.    Exercising: yes morning work outs/ run Smoker:  Former smoker  Health Maintenance: Pap:  Never History of abnormal Pap:  N/A TDaP:  2013 Gardasil: no    reports that she has quit smoking. Her smoking use included Cigarettes. She has a .25 pack-year smoking history. She has never used smokeless tobacco. She reports that she drinks about 1.2 oz of alcohol per week. She reports that she does not use illicit drugs.She is working as a Scientist, physiologicalreceptionist, works in Therapist, occupationalcatering, helps with building, Geophysicist/field seismologistteaching assistant. Plans to go to grad school in archeology or geomorphology  Past Medical History  Diagnosis Date  . Anemia   . Anxiety   . Depression   . Thyroid disease   . Marfan syndrome   . Low blood pressure   . Ehlers-Danlos disease   . Mitral valve regurgitation     Past Surgical History  Procedure Laterality Date  . Mole removal      back    Current Outpatient Prescriptions  Medication Sig Dispense Refill  . levothyroxine (SYNTHROID, LEVOTHROID) 75 MCG tablet Take 1 tablet (75 mcg total) by mouth daily  before breakfast. 30 tablet 1  . naproxen sodium (ANAPROX DS) 550 MG tablet 1 tab po q 12 hours prn pain 30 tablet 2   No current facility-administered medications for this visit.    Family History  Problem Relation Age of Onset  . Cancer Maternal Grandmother   . Diabetes Maternal Grandmother   . Breast cancer Maternal Grandmother   . Diabetes Maternal Grandfather   . Cancer Paternal Grandmother   . Breast cancer Paternal Grandmother   . Cancer Paternal Grandfather   . Thyroid disease Mother   . Healthy Father     Review of Systems  Constitutional: Negative.   HENT: Negative.   Eyes: Negative.   Respiratory: Negative.   Cardiovascular: Negative.   Gastrointestinal: Negative.   Endocrine: Negative.   Genitourinary: Negative.   Musculoskeletal: Negative.   Skin: Negative.   Allergic/Immunologic: Negative.   Neurological: Negative.   Psychiatric/Behavioral: Negative.     Exam:   BP 98/60 mmHg  Pulse 80  Resp 14  Ht 5' 4.5" (1.638 m)  Wt 134 lb (60.782 kg)  BMI 22.65 kg/m2  LMP 11/04/2015  Weight change: @WEIGHTCHANGE @ Height:   Height: 5' 4.5" (163.8 cm)  Ht Readings from Last 3 Encounters:  11/08/15 5' 4.5" (1.638 m)  10/08/15 5' 4.5" (1.638 m)  10/03/15 5' 4.5" (1.638 m)    General appearance: alert, cooperative and appears stated age Head: Normocephalic, without obvious abnormality, atraumatic Neck:  no adenopathy, supple, symmetrical, trachea midline and thyroid normal to inspection and palpation Lungs: clear to auscultation bilaterally Breasts: normal appearance, no masses or tenderness Heart: regular rate and rhythm Abdomen: soft, non-tender; bowel sounds normal; no masses,  no organomegaly Extremities: extremities normal, atraumatic, no cyanosis or edema Skin: Skin color, texture, turgor normal. No rashes or lesions Lymph nodes: Cervical, supraclavicular, and axillary nodes normal. No abnormal inguinal nodes palpated Neurologic: Grossly  normal   Pelvic: External genitalia:  no lesions. On her upper left thigh she has 3 distinct erythematous papules with an umbilicated center. On her right upper thigh she has a large skin tag.               Urethra:  normal appearing urethra with no masses, tenderness or lesions              Bartholins and Skenes: normal                 Vagina: normal appearing vagina with normal color and discharge, no lesions              Cervix: no lesions               Bimanual Exam:  Uterus:  normal size, contour, position, consistency, mobility, non-tender and retroverted              Adnexa: no mass, fullness, tenderness               Rectovaginal: Confirms               Anus:  normal sphincter tone, no lesions  Chaperone was present for exam.  A:  Well Woman with normal exam  Molluscum contagiosum, some lesions have resolved, some new ones have formed  Contraception, she declines contraception, just using w/d, discussed the risk of pregnancy   P:   Pap   STD testing  Declines contraception  Recommended to start PNV  Discussed breast self exam  Discussed calcium and vit D  Start gardasil series  Discussed the option of treating the molluscom

## 2015-11-08 NOTE — Patient Instructions (Signed)
Molluscum Contagiosum, Adult  Molluscum contagiosum is a skin infection that can cause a rash. When molluscum contagiosum affects the genital area, it is called a sexually transmitted disease (STD).  CAUSES  Molluscum contagiosum is caused by a virus. The virus can spread easily from person to person through:  · Skin-to-skin contact with an infected person.  · Contact with an infected object, such as a towel or clothing.  RISK FACTORS  You may be at higher risk for molluscum contagiosum if you:  · Live in an area where the weather is moist and warm.  · Have a weak body defense system (immune system).  SIGNS AND SYMPTOMS  The main symptom is a rash that appears 2-7 weeks after exposure to the virus. It is made up of 2-20 small, firm, dome-shaped bumps that may:  · Be pink or flesh-colored.  · Appear alone or in groups.  · Range from the size of a pinhead to the size of a pencil eraser.  · Feel smooth and waxy.  · Have a pit in the middle.  · Itch. The rash does not itch for most people.  The bumps often appears on the genitals, thighs, face, neck, and abdomen.  DIAGNOSIS   A health care provider can usually diagnose molluscum contagiosum by looking at the bumps on your skin. To confirm the diagnosis, your health care provider may scrape the bumps to collect a skin sample to examine under a microscope.  TREATMENT  The bumps may go away on their own, but people often have treatment to keep the virus from infecting someone else or to keep the rash from spreading to other body parts. Treatment may include:  · Surgery to remove the bumps by freezing them (cryosurgery).  · A procedure to scrape off the bumps (curettage).  · A procedure to remove the bumps with a laser.  · Putting medicine on the bumps (topical treatment).  Sometimes no treatment is needed.   HOME CARE INSTRUCTIONS  · Take medicines only as directed by your health care provider.  · As long as you have bumps on your skin, the infection can spread to others  and to other parts of your body. To prevent this from happening:    Do not scratch the bumps.    Do not share clothing or towels with others until the bumps disappear.      Avoid close contact with others until the bumps disappear. This includes sexual contact.     Wash your hands often.    Cover the bumps with clothing or a bandage when you will be near other people.  SEEK MEDICAL CARE IF:  · The bumps are spreading.  · The bumps are becoming red and sore.  · The bumps have not gone away after 12 months.     This information is not intended to replace advice given to you by your health care provider. Make sure you discuss any questions you have with your health care provider.     Document Released: 03/15/2014 Document Reviewed: 03/15/2014  Elsevier Interactive Patient Education ©2016 [employer]vier Inc[employer]

## 2015-11-09 LAB — STD PANEL
HIV 1&2 Ab, 4th Generation: NONREACTIVE
Hepatitis B Surface Ag: NEGATIVE

## 2015-11-09 LAB — IPS PAP TEST WITH REFLEX TO HPV

## 2015-11-09 LAB — HEPATITIS C ANTIBODY: HCV Ab: NEGATIVE

## 2015-11-12 LAB — IPS N GONORRHOEA AND CHLAMYDIA BY PCR

## 2015-11-14 ENCOUNTER — Telehealth: Payer: Self-pay | Admitting: Family

## 2015-11-14 NOTE — Telephone Encounter (Signed)
Pt requesting refill for levothyroxine (SYNTHROID, LEVOTHROID) 75 MCG tablet [[id number]][[id number]]  Pharmacy [facility] on [address]

## 2015-11-15 MED ORDER — LEVOTHYROXINE SODIUM 75 MCG PO TABS: 75.0000 ug | ORAL_TABLET | Freq: Every day | ORAL | Status: DC

## 2015-11-15 NOTE — Telephone Encounter (Signed)
Rx filled. Pt aware.

## 2015-12-12 ENCOUNTER — Ambulatory Visit: Payer: BLUE CROSS/BLUE SHIELD | Admitting: Neurology

## 2015-12-24 ENCOUNTER — Ambulatory Visit (INDEPENDENT_AMBULATORY_CARE_PROVIDER_SITE_OTHER): Payer: BLUE CROSS/BLUE SHIELD | Admitting: Cardiovascular Disease

## 2015-12-24 ENCOUNTER — Encounter: Payer: Self-pay | Admitting: Cardiovascular Disease

## 2015-12-24 VITALS — BP 110/68 | HR 84 | Ht 64.5 in | Wt 135.0 lb

## 2015-12-24 DIAGNOSIS — Q8740 Marfan's syndrome, unspecified: Secondary | ICD-10-CM

## 2015-12-24 NOTE — Patient Instructions (Signed)
Medication Instructions:  Your physician recommends that you continue on your current medications as directed. Please refer to the Current Medication list given to you today.   Labwork: None Ordered   Testing/Procedures: None Ordered   Follow-Up: Your physician wants you to follow-up in: 2 years with Dr. Nahser.  You will receive a reminder letter in the mail two months in advance. If you don't receive a letter, please call our office to schedule the follow-up appointment.   If you need a refill on your cardiac medications before your next appointment, please call your pharmacy.   Thank you for choosing [facility]! Jevaeh Shams, RN [phone]    

## 2015-12-24 NOTE — Progress Notes (Signed)
Cardiology Office Note   Date:  12/24/2015   ID:  Megan [name], DOB May 11, 1991, MRN [id number]  PCP:  Jeanine Luz, FNP  Cardiologist:   Vesta Mixer, MD   Chief Complaint  Patient presents with  . Follow-up    Marfans syndrome   Problem List 1. Marfans Syndrome 2. Chest pain  3. hyperflexability  4. Hypothyroidism   History of Present Illness: Megan [name] is a 25 y.o. female who presents for further evaluation of her Marfan Syndrome  Was told around age 35 -61 that she had Marfans syndrome.     Has had some chest pain  Was seen in the ER. The pain is worse with moving or taking a deep breath Lasts only a split seconds - like " a string bean pluck"  Used to play sports in high school ,  Not as active. Is an Teacher, English as a foreign language ,   She is a Office manager in the Animal nutritionist at [employer] .  Works on Media planner sites during [employer].   December 24, 2015:  Megan [name] is doing well Echo shows mild MR,  Normal aortic root size. Exercising some .  Some CP - very atypical  Going to [place] for an Scientist, physiological project this summer for 3 months   Past Medical History  Diagnosis Date  . Anemia   . Anxiety   . Depression   . Thyroid disease   . Marfan syndrome   . Low blood pressure   . Ehlers-Danlos disease   . Mitral valve regurgitation     Past Surgical History  Procedure Laterality Date  . Mole removal      back     Current Outpatient Prescriptions  Medication Sig Dispense Refill  . levothyroxine (SYNTHROID, LEVOTHROID) 75 MCG tablet Take 1 tablet (75 mcg total) by mouth daily before breakfast. 30 tablet 1  . naproxen sodium (ANAPROX DS) 550 MG tablet 1 tab po q 12 hours prn pain 30 tablet 2   No current facility-administered medications for this visit.    Allergies:   Asa and Latex    Social History:  The patient  reports that she has quit smoking. Her smoking use included Cigarettes. She has a .25 pack-year smoking history. She has never used smokeless tobacco.  She reports that she drinks about 1.2 oz of alcohol per week. She reports that she does not use illicit drugs.   Family History:  The patient's family history includes Breast cancer in her maternal grandmother and paternal grandmother; Cancer in her maternal grandmother, paternal grandfather, and paternal grandmother; Diabetes in her maternal grandfather and maternal grandmother; Healthy in her father; Thyroid disease in her mother.    ROS:  Please see the history of present illness.    Review of Systems: Constitutional:  denies fever, chills, diaphoresis, appetite change and fatigue.  HEENT: denies photophobia, eye pain, redness, hearing loss, ear pain, congestion, sore throat, rhinorrhea, sneezing, neck pain, neck stiffness and tinnitus.  Respiratory: denies SOB, DOE, cough, chest tightness, and wheezing.  Cardiovascular: admits to chest pain,  She denies  palpitations and leg swelling.  Gastrointestinal: denies nausea, vomiting, abdominal pain, diarrhea, constipation, blood in stool.  Genitourinary: denies dysuria, urgency, frequency, hematuria, flank pain and difficulty urinating.  Musculoskeletal: denies  myalgias, back pain, joint swelling, arthralgias and gait problem.   Skin: denies pallor, rash and wound.  Neurological: denies dizziness, seizures, syncope, weakness, light-headedness, numbness and headaches.   Hematological: denies adenopathy, easy bruising, personal or family bleeding history.  Psychiatric/  Behavioral: denies suicidal ideation, mood changes, confusion, nervousness, sleep disturbance and agitation.     All other systems are reviewed and negative.   PHYSICAL EXAM: VS:  BP 110/68 mmHg  Pulse 84  Ht 5' 4.5" (1.638 m)  Wt 135 lb (61.236 kg)  BMI 22.82 kg/m2  SpO2 98% , BMI Body mass index is 22.82 kg/(m^2). GEN: Well nourished, well developed, in no acute distress HEENT: normal Neck: no JVD, carotid bruits, or masses Cardiac: RRR; very soft systolic murmur , no   rubs, or gallops,no edema  Respiratory:  clear to auscultation bilaterally, normal work of breathing GI: soft, nontender, nondistended, + BS MS: no deformity or atrophy Skin: warm and dry, no rash Neuro:  Strength and sensation are intact Psych: normal  EKG:  EKG is not ordered today. The ekg ordered Jan. 12, 2017  demonstrates sinus arrhythmia.   Recent Labs: 09/12/2015: BUN 17; Creatinine, Ser 0.81; Hemoglobin 13.6; Platelets 238; Potassium 4.0; Sodium 141; TSH 6.269*   Lipid Panel No results found for: CHOL, TRIG, HDL, CHOLHDL, VLDL, LDLCALC, LDLDIRECT    Wt Readings from Last 3 Encounters:  12/24/15 135 lb (61.236 kg)  11/08/15 134 lb (60.782 kg)  10/08/15 136 lb 12.8 oz (62.052 kg)      Other studies Reviewed: Additional studies/ records that were reviewed today include: . Review of the above records demonstrates:    ASSESSMENT AND PLAN:  1.   Marfan syndrome  She carries the diagnosis of Marfan syndrome.   Ehlers-Danlos disease is also listed as a diagnosis.    Both are associates with aortic root dilitation .   She's not all that told and she does not have other characteristics of typical Marfan syndrome. She is nearsighted but not all that severe. She does have some  hyperflexibility but again not as much as we typically see with Marfan syndrome.   Echo revealed mild MR. Normal aortic root size.  Will see her in 2 years .   Current medicines are reviewed at length with the patient today.  The patient does not have concerns regarding medicines.  The following changes have been made:  no change  Labs/ tests ordered today include:  No orders of the defined types were placed in this encounter.     Disposition:   FU with me in several months to re-evaluate her atypical CP.       Gwendolin Briel, Deloris PingPhilip J, MD  12/24/2015 9:49 AM    [facility] [address], [place], [place]  [address] Phone: [phone]) [phone]; Fax: [phone]   [facility]  Office  [address] [place], [place]  [phone]   Fax [phone]

## 2016-01-25 ENCOUNTER — Other Ambulatory Visit: Payer: Self-pay | Admitting: Family

## 2016-07-02 IMAGING — CR DG CHEST 2V
2 series · 2 of 2 positions shown · non-contrast
Comparison: Chest radiograph dated 08/17/2014

CLINICAL DATA: 24-year-old female with chest pain

EXAM:
CHEST  2 VIEW

[w chest pa]
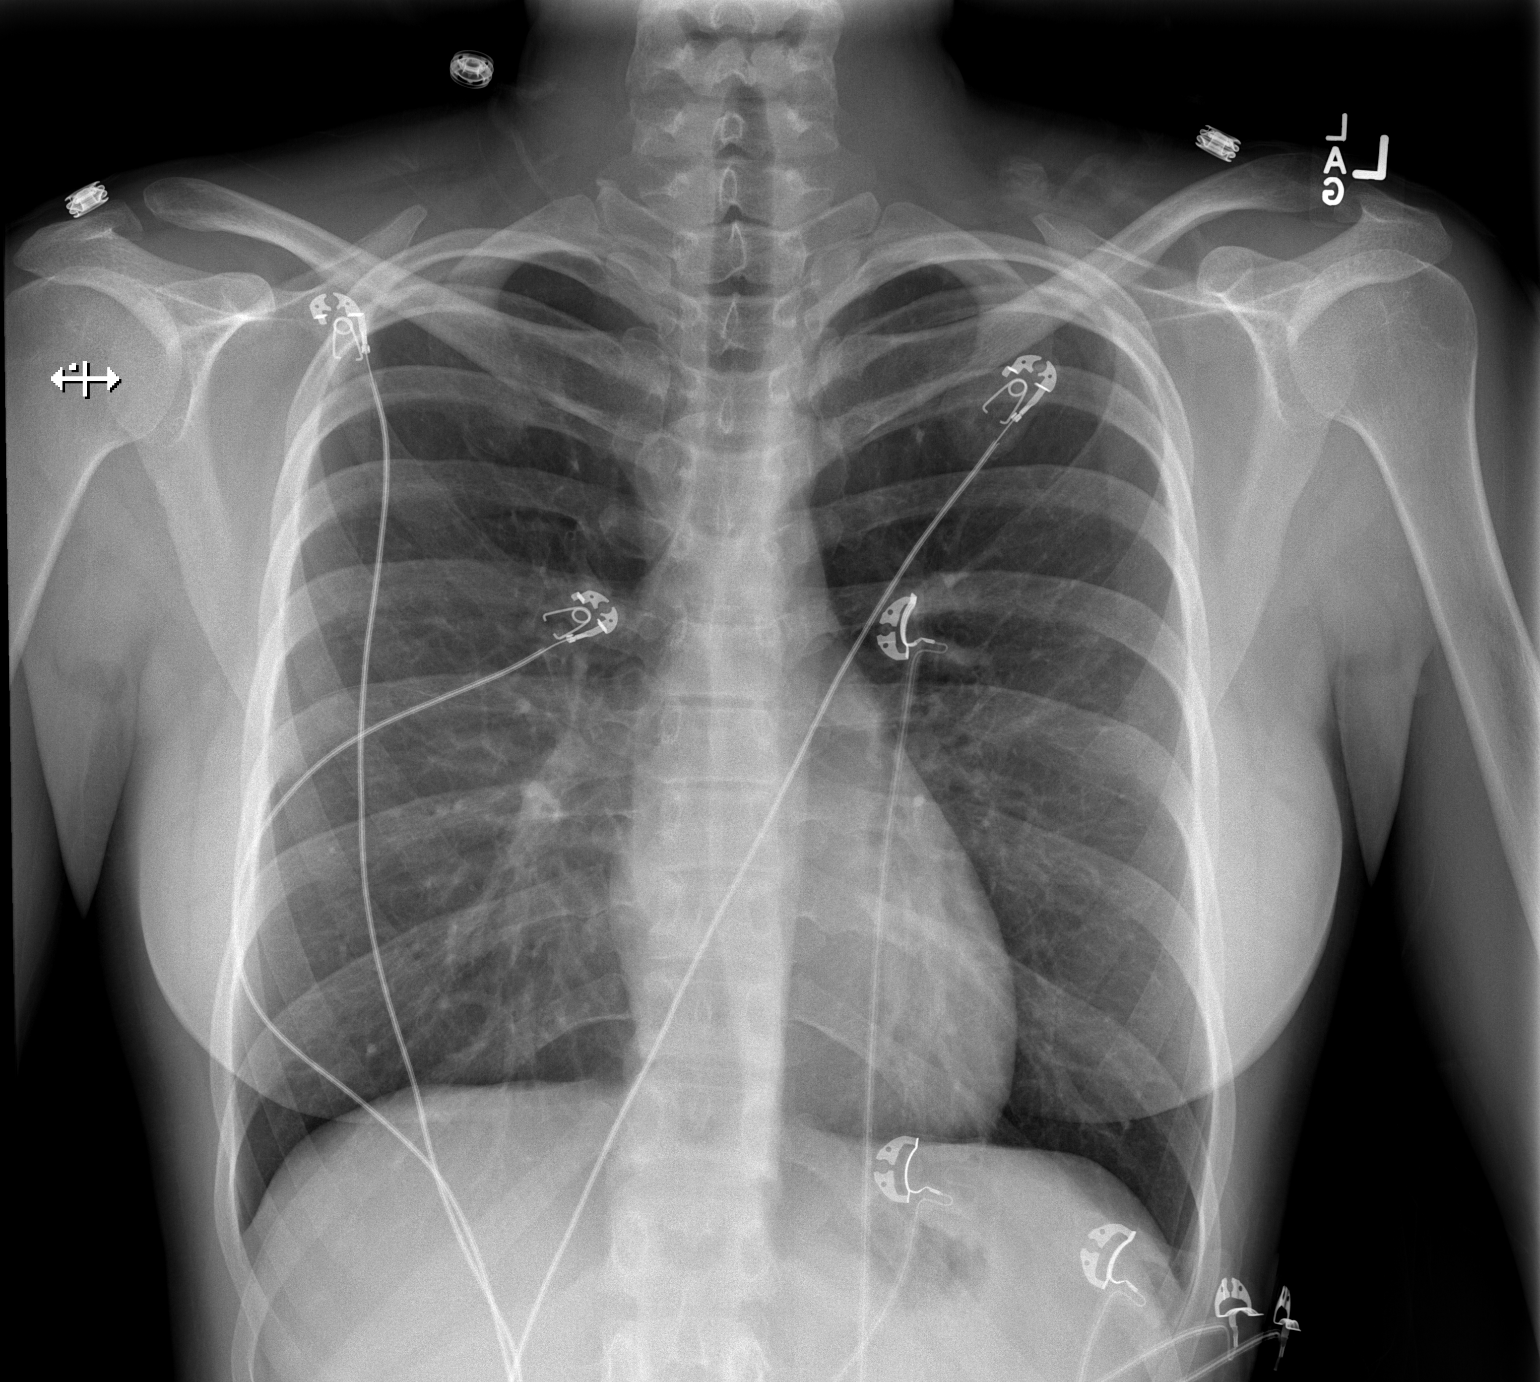

[w chest lat]
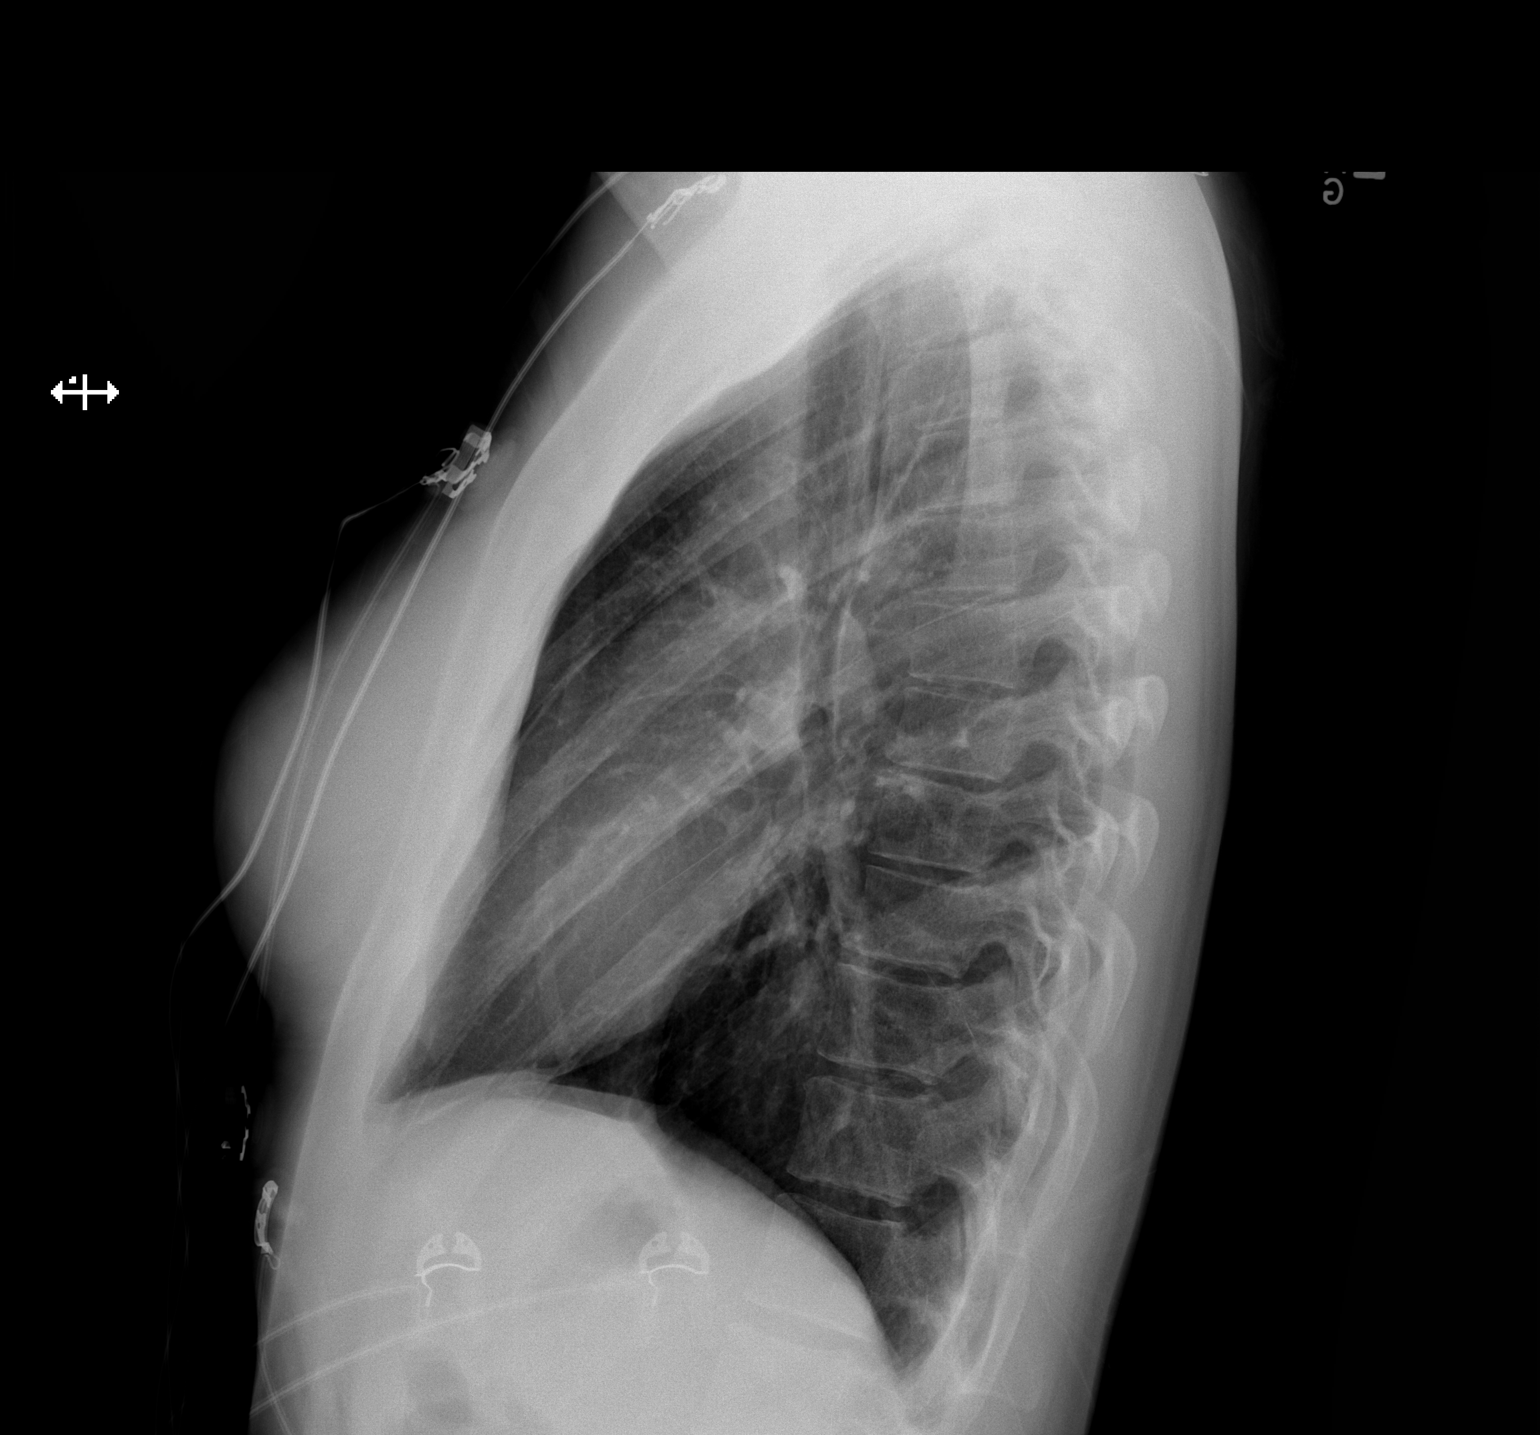

[2 of 2 positions shown; findings below may reference images not displayed]

FINDINGS: The heart size and mediastinal contours are within normal limits.
Both lungs are clear. The visualized skeletal structures are
unremarkable.
IMPRESSION: No active cardiopulmonary disease.

## 2016-11-13 ENCOUNTER — Encounter: Payer: Self-pay | Admitting: Obstetrics and Gynecology

## 2016-11-13 ENCOUNTER — Ambulatory Visit: Payer: BLUE CROSS/BLUE SHIELD | Admitting: Obstetrics and Gynecology

## 2017-04-21 ENCOUNTER — Ambulatory Visit (INDEPENDENT_AMBULATORY_CARE_PROVIDER_SITE_OTHER): Payer: BLUE CROSS/BLUE SHIELD | Admitting: Obstetrics and Gynecology

## 2017-04-21 ENCOUNTER — Encounter: Payer: Self-pay | Admitting: Obstetrics and Gynecology

## 2017-04-21 VITALS — BP 100/58 | HR 72 | Resp 16 | Ht 64.5 in | Wt 116.0 lb

## 2017-04-21 DIAGNOSIS — Z23 Encounter for immunization: Secondary | ICD-10-CM | POA: Diagnosis not present

## 2017-04-21 DIAGNOSIS — Z862 Personal history of diseases of the blood and blood-forming organs and certain disorders involving the immune mechanism: Secondary | ICD-10-CM

## 2017-04-21 DIAGNOSIS — N970 Female infertility associated with anovulation: Secondary | ICD-10-CM | POA: Diagnosis not present

## 2017-04-21 DIAGNOSIS — Z0000 Encounter for general adult medical examination without abnormal findings: Secondary | ICD-10-CM

## 2017-04-21 DIAGNOSIS — L680 Hirsutism: Secondary | ICD-10-CM | POA: Diagnosis not present

## 2017-04-21 DIAGNOSIS — Z01419 Encounter for gynecological examination (general) (routine) without abnormal findings: Secondary | ICD-10-CM | POA: Diagnosis not present

## 2017-04-21 DIAGNOSIS — N921 Excessive and frequent menstruation with irregular cycle: Secondary | ICD-10-CM | POA: Diagnosis not present

## 2017-04-21 DIAGNOSIS — N946 Dysmenorrhea, unspecified: Secondary | ICD-10-CM | POA: Diagnosis not present

## 2017-04-21 DIAGNOSIS — E038 Other specified hypothyroidism: Secondary | ICD-10-CM

## 2017-04-21 LAB — POCT URINE PREGNANCY: PREG TEST UR: NEGATIVE

## 2017-04-21 MED ORDER — NAPROXEN SODIUM 550 MG PO TABS: 550.0000 mg | ORAL_TABLET | Freq: Two times a day (BID) | ORAL | 1 refills | Status: DC

## 2017-04-21 NOTE — Patient Instructions (Signed)

## 2017-04-21 NOTE — Progress Notes (Signed)
26 y.o. G0P0000 SingleCaucasianF here for annual exam.  Cycles are irregular. Menses q 4-8 weeks. Random. She can bleed for 4 days or for up to a month. At times she can saturate a pad and tampon in 30 minutes. The heavy bleeding can start and stop. Severe cramps. She takes ibuprofen, doesn't help as much as anaprox has in the past.  Sexually active, same partner x 3 years. Live together. Using condoms most of the time. Occasional pain with intercourse, deep. Positional.  Mild acne. Slight hair growth on her chin, middle of her chest, lower abdomen. She c/o fatigue. No SOB. Occasionally gets dizzy when she is bleeding heavily. No galactorrhea. She tried OTC iron, but it upset her stomach. She misses work during her cycle. She hasn't been on her synthroid for over a year. Over due to see the Endocrinologist.  Period Duration (Days): 4 - 21 days  sometimes very heavy and bleeds through vlothes  Period Pattern: (!) Irregular Menstrual Flow: Light, Heavy Menstrual Control: Maxi pad, Tampon Menstrual Control Change Freq (Hours): changes both pad and tampon every 30 mins on heavy days  Dysmenorrhea: (!) Severe Dysmenorrhea Symptoms: Cramping  Patient's last menstrual period was 03/10/2017 (approximate).          Sexually active: Yes.    The current method of family planning is condoms most of the time.    Exercising: Yes.    walking Smoker:  Yes- E-Cig  Health Maintenance: Pap:  11-08-15 WNL  History of abnormal Pap:  no TDaP:  2014 Gardasil: completed 1   reports that she has been smoking E-cigarettes.  She has a 0.25 pack-year smoking history. She has never used smokeless tobacco. She reports that she drinks about 1.2 oz of alcohol per week . She reports that she does not use drugs. Works as a Child psychotherapist.   Past Medical History:  Diagnosis Date  . Anemia   . Anxiety   . Depression   . Ehlers-Danlos disease   . Low blood pressure   . Marfan syndrome   . Mitral valve regurgitation   . Thyroid  disease     Past Surgical History:  Procedure Laterality Date  . MOLE REMOVAL     back    Current Outpatient Prescriptions  Medication Sig Dispense Refill  . levothyroxine (SYNTHROID, LEVOTHROID) 75 MCG tablet Take 1 tablet (75 mcg total) by mouth daily before breakfast. (Patient not taking: Reported on 04/21/2017) 30 tablet 1   No current facility-administered medications for this visit.     Family History  Problem Relation Age of Onset  . Cancer Maternal Grandmother   . Diabetes Maternal Grandmother   . Breast cancer Maternal Grandmother   . Diabetes Maternal Grandfather   . Cancer Paternal Grandmother   . Breast cancer Paternal Grandmother   . Cancer Paternal Grandfather   . Thyroid disease Mother   . Healthy Father     Review of Systems  Constitutional: Negative.   HENT: Negative.   Eyes: Negative.   Respiratory: Negative.   Cardiovascular: Negative.   Gastrointestinal: Negative.   Endocrine: Positive for polydipsia.  Genitourinary: Positive for menstrual problem.       Dysmenorrhea Menorrhagia  Irregular bleeding    Musculoskeletal: Negative.   Skin: Negative.        Excessive hair growth   Allergic/Immunologic: Negative.   Neurological: Negative.   Psychiatric/Behavioral: Negative.     Exam:   BP (!) 100/58 (BP Location: Right Arm, Patient Position: Sitting, Cuff Size: Normal)  Pulse 72   Resp 16   Ht 5' 4.5" (1.638 m)   Wt 116 lb (52.6 kg)   LMP 03/10/2017 (Approximate)   BMI 19.60 kg/m   Weight change: @WEIGHTCHANGE @ Height:   Height: 5' 4.5" (163.8 cm)  Ht Readings from Last 3 Encounters:  04/21/17 5' 4.5" (1.638 m)  12/24/15 5' 4.5" (1.638 m)  11/08/15 5' 4.5" (1.638 m)    General appearance: alert, cooperative and appears stated age Head: Normocephalic, without obvious abnormality, atraumatic Neck: no adenopathy, supple, symmetrical, trachea midline and thyroid normal to inspection and palpation Lungs: clear to auscultation  bilaterally Cardiovascular: regular rate and rhythm Breasts: normal appearance, no masses or tenderness Abdomen: soft, non-tender; bowel sounds normal; no masses,  no organomegaly Extremities: extremities normal, atraumatic, no cyanosis or edema Skin: Skin color, texture, turgor normal. No rashes or lesions Lymph nodes: Cervical, supraclavicular, and axillary nodes normal. No abnormal inguinal nodes palpated Neurologic: Grossly normal   Pelvic: External genitalia:  no lesions              Urethra:  normal appearing urethra with no masses, tenderness or lesions              Bartholins and Skenes: normal                 Vagina: normal appearing vagina with normal color and discharge, no lesions              Cervix: no lesions               Bimanual Exam:  Uterus:  normal size, contour, position, consistency, mobility, non-tender and retroverted              Adnexa: no mass, fullness, tenderness               Rectovaginal: Confirms               Anus:  normal sphincter tone, no lesions  Chaperone was present for exam.  A:  Well Woman with normal exam  AUB, menometrorrhagia   Dysmenorrhea  Hypothyroid, off of her synthroid. Having anovulatory bleeding  Contraception, currently declines. Didn't tolerate OCP's in the past secondary to mood changes  P:   Declines STD testing  No pap this year  CBC, Ferritin, TSH, prolactin  UPT negative  Screening labs  Anaprox for cramps  Discussed the options of OCP's, mirena IUD and cyclic provera to control her bleeding  I suspect her bleeding is at least partially abnormal because of her untreated hypothyroidism  Second gardasil today, set her up for her 3rd

## 2017-04-27 LAB — CBC
HEMATOCRIT: 37 % (ref 34.0–46.6)
Hemoglobin: 12.2 g/dL (ref 11.1–15.9)
MCH: 29.1 pg (ref 26.6–33.0)
MCHC: 33 g/dL (ref 31.5–35.7)
MCV: 88 fL (ref 79–97)
PLATELETS: 264 10*3/uL (ref 150–379)
RBC: 4.19 x10E6/uL (ref 3.77–5.28)
RDW: 14.8 % (ref 12.3–15.4)
WBC: 6.2 10*3/uL (ref 3.4–10.8)

## 2017-04-27 LAB — COMPREHENSIVE METABOLIC PANEL
A/G RATIO: 1.7 (ref 1.2–2.2)
ALT: 11 IU/L (ref 0–32)
AST: 18 IU/L (ref 0–40)
Albumin: 4.6 g/dL (ref 3.5–5.5)
Alkaline Phosphatase: 45 IU/L (ref 39–117)
BILIRUBIN TOTAL: 0.6 mg/dL (ref 0.0–1.2)
BUN/Creatinine Ratio: 19 (ref 9–23)
BUN: 13 mg/dL (ref 6–20)
CALCIUM: 9.6 mg/dL (ref 8.7–10.2)
CHLORIDE: 98 mmol/L (ref 96–106)
CO2: 25 mmol/L (ref 20–29)
Creatinine, Ser: 0.69 mg/dL (ref 0.57–1.00)
GFR, EST AFRICAN AMERICAN: 140 mL/min/{1.73_m2} (ref >59)
GFR, EST NON AFRICAN AMERICAN: 121 mL/min/{1.73_m2} (ref >59)
GLOBULIN, TOTAL: 2.7 g/dL (ref 1.5–4.5)
Glucose: 94 mg/dL (ref 65–99)
POTASSIUM: 4.8 mmol/L (ref 3.5–5.2)
SODIUM: 137 mmol/L (ref 134–144)
TOTAL PROTEIN: 7.3 g/dL (ref 6.0–8.5)

## 2017-04-27 LAB — PROLACTIN: PROLACTIN: 8.4 ng/mL (ref 4.8–23.3)

## 2017-04-27 LAB — 17-HYDROXYPROGESTERONE: 17 HYDROXYPROGESTERONE: 173 ng/dL

## 2017-04-27 LAB — LIPID PANEL
CHOL/HDL RATIO: 1.8 ratio (ref 0.0–4.4)
Cholesterol, Total: 145 mg/dL (ref 100–199)
HDL: 82 mg/dL (ref >39)
LDL Calculated: 52 mg/dL (ref 0–99)
Triglycerides: 56 mg/dL (ref 0–149)
VLDL Cholesterol Cal: 11 mg/dL (ref 5–40)

## 2017-04-27 LAB — TESTT+TESTF+SHBG
Sex Hormone Binding: 42.2 nmol/L (ref 24.6–122.0)
Testosterone, Free: 2.6 pg/mL (ref 0.0–4.2)
Testosterone, total: 20.9 ng/dL (ref 10.0–55.0)

## 2017-04-27 LAB — FERRITIN: FERRITIN: 13 ng/mL — AB (ref 15–150)

## 2017-04-27 LAB — TSH: TSH: 1.36 u[IU]/mL (ref 0.450–4.500)

## 2017-04-27 LAB — DHEA-SULFATE: DHEA-SO4: 207.4 ug/dL (ref 84.8–378.0)

## 2017-08-21 ENCOUNTER — Ambulatory Visit: Payer: BLUE CROSS/BLUE SHIELD

## 2017-08-21 ENCOUNTER — Telehealth: Payer: Self-pay | Admitting: Obstetrics and Gynecology

## 2017-08-21 NOTE — Telephone Encounter (Signed)
Patient no showed her nurse visit today for 3rd Guardasil shot.  Called patient to reschedule but unable to leave message patient mailbox is full

## 2017-08-21 NOTE — Telephone Encounter (Signed)
Please send her a letter reminding her to set up another appointment.

## 2017-08-27 ENCOUNTER — Encounter: Payer: Self-pay | Admitting: Obstetrics and Gynecology

## 2018-04-16 NOTE — Progress Notes (Deleted)
27 y.o. G0P0000 SingleCaucasianF here for annual exam.      No LMP recorded.          Sexually active: {yes no:314532}  The current method of family planning is {contraception:315051}.    Exercising: {yes no:314532}  {types:19826} Smoker:  {YES [id number]:22349}  Health Maintenance: Pap:  11/08/2015 History of abnormal Pap:  no MMG:  n/a Colonoscopy:  n/a BMD:   n/a TDaP:  2014 Gardasil: 04/21/2017, 11/08/2015   reports that she has been smoking e-cigarettes. She has a 0.25 pack-year smoking history. She has never used smokeless tobacco. She reports that she drinks about 2.0 standard drinks of alcohol per week. She reports that she does not use drugs.  Past Medical History:  Diagnosis Date  . Anemia   . Anxiety   . Depression   . Ehlers-Danlos disease   . Low blood pressure   . Marfan syndrome   . Mitral valve regurgitation   . Thyroid disease     Past Surgical History:  Procedure Laterality Date  . MOLE REMOVAL     back    Current Outpatient Medications  Medication Sig Dispense Refill  . levothyroxine (SYNTHROID, LEVOTHROID) 75 MCG tablet Take 1 tablet (75 mcg total) by mouth daily before breakfast. (Patient not taking: Reported on 04/21/2017) 30 tablet 1  . naproxen sodium (ANAPROX DS) 550 MG tablet Take 1 tablet (550 mg total) by mouth 2 (two) times daily with a meal. 30 tablet 1   No current facility-administered medications for this visit.     Family History  Problem Relation Age of Onset  . Cancer Maternal Grandmother   . Diabetes Maternal Grandmother   . Breast cancer Maternal Grandmother   . Diabetes Maternal Grandfather   . Cancer Paternal Grandmother   . Breast cancer Paternal Grandmother   . Cancer Paternal Grandfather   . Thyroid disease Mother   . Healthy Father     Review of Systems  Constitutional: Negative.   HENT: Negative.   Eyes: Negative.   Respiratory: Negative.   Cardiovascular: Negative.   Gastrointestinal: Negative.   Endocrine: Negative.    Genitourinary: Negative.   Musculoskeletal: Negative.   Skin: Negative.   Allergic/Immunologic: Negative.   Neurological: Negative.   Hematological: Negative.   Psychiatric/Behavioral: Negative.   All other systems reviewed and are negative.   Exam:   There were no vitals taken for this visit.  Weight change: @WEIGHTCHANGE @ Height:      Ht Readings from Last 3 Encounters:  04/21/17 5' 4.5" (1.638 m)  12/24/15 5' 4.5" (1.638 m)  11/08/15 5' 4.5" (1.638 m)    General appearance: alert, cooperative and appears stated age Head: Normocephalic, without obvious abnormality, atraumatic Neck: no adenopathy, supple, symmetrical, trachea midline and thyroid {CHL AMB PHY EX THYROID NORM DEFAULT:[phone]::"normal to inspection and palpation"} Lungs: clear to auscultation bilaterally Cardiovascular: regular rate and rhythm Breasts: {Exam; breast:13139::"normal appearance, no masses or tenderness"} Abdomen: soft, non-tender; non distended,  no masses,  no organomegaly Extremities: extremities normal, atraumatic, no cyanosis or edema Skin: Skin color, texture, turgor normal. No rashes or lesions Lymph nodes: Cervical, supraclavicular, and axillary nodes normal. No abnormal inguinal nodes palpated Neurologic: Grossly normal   Pelvic: External genitalia:  no lesions              Urethra:  normal appearing urethra with no masses, tenderness or lesions              Bartholins and Skenes: normal  Vagina: normal appearing vagina with normal color and discharge, no lesions              Cervix: {CHL AMB PHY EX CERVIX NORM [phone]::"no lesions"}               Bimanual Exam:  Uterus:  {CHL AMB PHY EX UTERUS NORM [phone]::"normal size, contour, position, consistency, mobility, non-tender"}              Adnexa: {CHL AMB PHY EX ADNEXA NO MASS [phone]::"no mass, fullness, tenderness"}               Rectovaginal: Confirms               Anus:  normal  sphincter tone, no lesions  Chaperone was present for exam.  A:  Well Woman with normal exam  P:

## 2018-04-26 ENCOUNTER — Ambulatory Visit: Payer: BLUE CROSS/BLUE SHIELD | Admitting: Obstetrics and Gynecology

## 2018-04-26 ENCOUNTER — Encounter: Payer: Self-pay | Admitting: Obstetrics and Gynecology

## 2019-06-17 ENCOUNTER — Other Ambulatory Visit: Payer: Self-pay

## 2019-06-17 DIAGNOSIS — Z20822 Contact with and (suspected) exposure to covid-19: Secondary | ICD-10-CM

## 2019-06-19 LAB — NOVEL CORONAVIRUS, NAA: SARS-CoV-2, NAA: DETECTED — AB

## 2019-06-24 ENCOUNTER — Other Ambulatory Visit: Payer: Self-pay

## 2019-06-24 DIAGNOSIS — Z20822 Contact with and (suspected) exposure to covid-19: Secondary | ICD-10-CM

## 2019-06-25 LAB — NOVEL CORONAVIRUS, NAA: SARS-CoV-2, NAA: DETECTED — AB

## 2019-12-21 ENCOUNTER — Encounter: Payer: Self-pay | Admitting: General Practice

## 2021-01-08 ENCOUNTER — Telehealth: Payer: Self-pay | Admitting: *Deleted

## 2021-01-08 ENCOUNTER — Encounter: Payer: Self-pay | Admitting: *Deleted

## 2021-01-08 NOTE — Telephone Encounter (Signed)
RN notified by HR that called out of work today citing a stomachache. Spoke with pt over phone. She reports that she was vague with call out as her sx are abd cramping r/t menstrual cycle. Hx PCOS. Sx are common and recurrent for her. She is unable to stand long periods of time due to sx and called out for this reason. Advised pt that HR would be notified of "personal medical" and pt cleared to RTW tomorrow. Follow normal call out procedures if needed tomorrow. Pt verbalizes understanding and agreement. Denies further questions/concerns.

## 2021-01-08 NOTE — Telephone Encounter (Signed)
Reviewed RN Rolly Salter note agreed with plan of care.  HR notified no covid testing at this time and personal medical.

## 2021-01-10 NOTE — Telephone Encounter (Signed)
Pt did RTW yesterday 5/11 as expected. No recurrent or new sx. Denies any questions or concerns.

## 2021-01-11 NOTE — Telephone Encounter (Signed)
Reviewed RN Rolly Salter note; patient returned to work as expected  Late entry attempted to reach patient 01/09/2021 no answer left message following up to see if symptoms/returned to work.

## 2021-04-18 ENCOUNTER — Ambulatory Visit: Payer: Self-pay | Admitting: *Deleted

## 2021-04-18 ENCOUNTER — Other Ambulatory Visit: Payer: Self-pay

## 2021-04-18 VITALS — BP 120/79 | HR 86

## 2021-04-18 DIAGNOSIS — R110 Nausea: Secondary | ICD-10-CM

## 2021-04-18 NOTE — Progress Notes (Signed)
Pt c/o nausea x2 weeks. Requests BP check as she has been told in the past her BP runs low and wondering if this is causing sx. BP and pulse WNL.  Period one week late. UPT at home last night was negative.  Also reports Hx of anemia. Some heat sensitivity but not cold. Reports she has noticed increased fatigue over past several weeks. Denies palpitations, dizziness or weakness, bleeding.  Denies new medications or foods.  Advised she repeat UPT in 4-5 days if period has not started yet and return for NP appt 8/23. Pt agreeable and denies further questions or concerns.

## 2021-04-22 ENCOUNTER — Encounter: Payer: Self-pay | Admitting: *Deleted

## 2021-04-22 ENCOUNTER — Telehealth: Payer: Self-pay | Admitting: *Deleted

## 2021-04-22 DIAGNOSIS — N946 Dysmenorrhea, unspecified: Secondary | ICD-10-CM

## 2021-04-22 DIAGNOSIS — R7989 Other specified abnormal findings of blood chemistry: Secondary | ICD-10-CM

## 2021-04-22 DIAGNOSIS — Z0000 Encounter for general adult medical examination without abnormal findings: Secondary | ICD-10-CM

## 2021-04-22 NOTE — Telephone Encounter (Signed)
Spoke with pt by phone after being notified by [employer] that pt called out of work today. She reports her typical menstrual cycle sx of cramping and nausea since yesterday. Denies v/d, URI sx, fever/chills, body aches. Sx are improving now. She plans to RTW tomorrow 8/23. HR notified personal medical.

## 2021-04-22 NOTE — Telephone Encounter (Signed)
Reviewed RN Rolly Salter note agreed with plan of care personal medical no covid testing or quarantine indicated at this time.

## 2021-04-26 NOTE — Telephone Encounter (Signed)
Patient contacted via telephone reported she took motrin 800mg  po TID prn menstrual cramps and was able to return to work the next day as planned.  Patient reported has not seen PCM/GYN since 2018 due to had lost insurance and only regained insurance.  Not on [employer] program at work because she vapes.  Was told in the past she had ovarian cysts cause of painful menses and to take naproxen but her rx ran out.  Along with her levothyroxine.  Patient last labs 2018.  Discussed with patient executive panel with Hgba1c can be drawn with RN 2019 in [facility].  Discussed naproxen (aleve) available OTC 220mg  tabs may take 2 every 12 hours as needed with food if this works better than motrin/ibuprofen/advil.  Discussed with patient I recommended she contact her PCM/GYN office to schedule routine visit and if office no longer open/not accepting new patients RN [facility] can assist her with finding [employer] network provider.  Patient reported she stopped thyroid medication after having chest pain/tingling in arms when told in ER her dose too high.  She stated she followed up with neurology and work up was normal.  Discussed with patient last TSH on file was normal.  Patient verbalized understanding information/instructions, agreed with plan of care and had no further questions at this time.

## 2021-06-03 ENCOUNTER — Other Ambulatory Visit: Payer: Self-pay

## 2021-06-03 ENCOUNTER — Ambulatory Visit (HOSPITAL_COMMUNITY)
Admission: EM | Admit: 2021-06-03 | Discharge: 2021-06-03 | Disposition: A | Discharge status: Home / Self Care | Payer: BLUE CROSS/BLUE SHIELD | Attending: Emergency Medicine | Admitting: Emergency Medicine

## 2021-06-03 ENCOUNTER — Encounter (HOSPITAL_COMMUNITY): Payer: Self-pay | Admitting: Emergency Medicine

## 2021-06-03 ENCOUNTER — Encounter: Payer: Self-pay | Admitting: *Deleted

## 2021-06-03 ENCOUNTER — Telehealth: Payer: Self-pay | Admitting: *Deleted

## 2021-06-03 DIAGNOSIS — J069 Acute upper respiratory infection, unspecified: Secondary | ICD-10-CM

## 2021-06-03 DIAGNOSIS — Z28310 Unvaccinated for covid-19: Secondary | ICD-10-CM | POA: Insufficient documentation

## 2021-06-03 DIAGNOSIS — B349 Viral infection, unspecified: Secondary | ICD-10-CM | POA: Insufficient documentation

## 2021-06-03 DIAGNOSIS — Z886 Allergy status to analgesic agent status: Secondary | ICD-10-CM | POA: Insufficient documentation

## 2021-06-03 DIAGNOSIS — F17210 Nicotine dependence, cigarettes, uncomplicated: Secondary | ICD-10-CM | POA: Insufficient documentation

## 2021-06-03 LAB — POCT RAPID STREP A, ED / UC: Streptococcus, Group A Screen (Direct): NEGATIVE

## 2021-06-03 MED ORDER — GUAIFENESIN ER 600 MG PO TB12: 600.0000 mg | ORAL_TABLET | Freq: Two times a day (BID) | ORAL | 0 refills | Status: DC

## 2021-06-03 MED ORDER — FLUTICASONE PROPIONATE 50 MCG/ACT NA SUSP: 1.0000 | Freq: Every day | NASAL | 1 refills | Status: AC

## 2021-06-03 NOTE — Discharge Instructions (Signed)
Your rapid strep test today was negative, and will be sent to lab to determine if it grows back.  If this occurs you will be notified and an antibiotic will be sent to the pharmacy  You can take Flonase every morning to help with nasal congestion  You can use Mucinex twice a day to help thin out secretions    You can take Tylenol and/or Ibuprofen as needed for fever reduction and pain relief.   For cough: honey 1/2 to 1 teaspoon (you can dilute the honey in water or another fluid).  You can also use dextromethorphan for cough. You can use a humidifier for chest congestion and cough.  If you don't have a humidifier, you can sit in the bathroom with the hot shower running.      For sore throat: try warm salt water gargles, cepacol lozenges, throat spray, warm tea or water with lemon/honey, popsicles or ice, or OTC cold relief medicine for throat discomfort.   For congestion: take a daily anti-histamine like Zyrtec, Claritin, and a oral decongestant, such as pseudoephedrine.    It is important to stay hydrated: drink plenty of fluids (water, gatorade/powerade/pedialyte, juices, or teas) to keep your throat moisturized and help further relieve irritation/discomfort.

## 2021-06-03 NOTE — ED Provider Notes (Signed)
[facility]    CSN: [id number] Arrival date & time: 06/03/21  7124      History   Chief Complaint Chief Complaint  Patient presents with  . Sore Throat    HPI Megan [name] is a 30 y.o. female.   Patient presents with sore throat, nasal congestion, rhinorrhea, body aches ,pressure around the right ear for 1 week .  Tolerating food and liquids.  No known sick contact.  Unvaccinated.  Home COVID test negative.  Denies cough, chest tightness, shortness of breath, wheezing, abdominal pain, nausea, vomiting, diarrhea.  Past Medical History:  Diagnosis Date  . Anemia   . Anxiety   . Depression   . Ehlers-Danlos disease   . Low blood pressure   . Marfan syndrome   . Mitral valve regurgitation   . Thyroid disease     Patient Active Problem List   Diagnosis Date Noted  . Routine general medical examination at a health care facility 10/08/2015  . Anemia   . Anxiety   . Depression   . Thyroid disease   . Marfan syndrome   . Low blood pressure   . Ehlers-Danlos disease     Past Surgical History:  Procedure Laterality Date  . MOLE REMOVAL     back    OB History     Gravida  0   Para  0   Term  0   Preterm  0   AB  0   Living  0      SAB  0   IAB  0   Ectopic  0   Multiple  0   Live Births               Home Medications    Prior to Admission medications   Medication Sig Start Date End Date Taking? Authorizing Provider  fluticasone (FLONASE) 50 MCG/ACT nasal spray Place 1 spray into both nostrils daily. 06/03/21  Yes Purity Irmen R, NP  guaiFENesin (MUCINEX) 600 MG 12 hr tablet Take 1 tablet (600 mg total) by mouth 2 (two) times daily. 06/03/21  Yes Farris Geiman, Elita Boone, NP  levothyroxine (SYNTHROID, LEVOTHROID) 75 MCG tablet Take 1 tablet (75 mcg total) by mouth daily before breakfast. Patient not taking: Reported on 04/21/2017 11/15/15   Veryl Speak, FNP  naproxen sodium (ANAPROX DS) 550 MG tablet Take 1 tablet (550 mg  total) by mouth 2 (two) times daily with a meal. 04/21/17   Romualdo Bolk, MD    Family History Family History  Problem Relation Age of Onset  . Cancer Maternal Grandmother   . Diabetes Maternal Grandmother   . Breast cancer Maternal Grandmother   . Diabetes Maternal Grandfather   . Cancer Paternal Grandmother   . Breast cancer Paternal Grandmother   . Cancer Paternal Grandfather   . Thyroid disease Mother   . Healthy Father     Social History Social History   Tobacco Use  . Smoking status: Every Day    Packs/day: 0.25    Years: 1.00    Pack years: 0.25    Types: E-cigarettes, Cigarettes  . Smokeless tobacco: Never  . Tobacco comments:    Smoked for 1 year  Substance Use Topics  . Alcohol use: Yes    Alcohol/week: 2.0 standard drinks    Types: 2 Cans of beer per week  . Drug use: No     Allergies   Asa [aspirin] and Latex   Review of Systems Review  of Systems  Constitutional: Negative.   HENT:  Positive for congestion and ear pain. Negative for dental problem, drooling, ear discharge, facial swelling, hearing loss, mouth sores, nosebleeds, postnasal drip, rhinorrhea, sinus pressure, sinus pain, sneezing, sore throat, tinnitus, trouble swallowing and voice change.   Respiratory: Negative.    Cardiovascular: Negative.   Gastrointestinal: Negative.   Skin: Negative.   Neurological: Negative.     Physical Exam Triage Vital Signs ED Triage Vitals [06/03/21 1002]  Enc Vitals Group     BP 109/72     Pulse Rate 84     Resp 16     Temp 99 F (37.2 C)     Temp Source Oral     SpO2 100 %     Weight      Height      Head Circumference      Peak Flow      Pain Score 6     Pain Loc      Pain Edu?      Excl. in GC?    No data found.  Updated Vital Signs BP 109/72 (BP Location: Left Arm)   Pulse 84   Temp 99 F (37.2 C) (Oral)   Resp 16   LMP 05/26/2021   SpO2 100%   Visual Acuity Right Eye Distance:   Left Eye Distance:   Bilateral  Distance:    Right Eye Near:   Left Eye Near:    Bilateral Near:     Physical Exam Constitutional:      Appearance: She is normal weight. She is ill-appearing.  HENT:     Head: Normocephalic.     Right Ear: Tympanic membrane, ear canal and external ear normal.     Left Ear: Tympanic membrane, ear canal and external ear normal.     Nose: Congestion present. No rhinorrhea.     Right Sinus: Frontal sinus tenderness present. No maxillary sinus tenderness.     Left Sinus: No maxillary sinus tenderness or frontal sinus tenderness.     Mouth/Throat:     Mouth: Mucous membranes are moist.     Pharynx: Posterior oropharyngeal erythema present.  Eyes:     Extraocular Movements: Extraocular movements intact.  Cardiovascular:     Rate and Rhythm: Normal rate and regular rhythm.     Pulses: Normal pulses.     Heart sounds: Normal heart sounds.  Pulmonary:     Effort: Pulmonary effort is normal.     Breath sounds: Normal breath sounds.  Musculoskeletal:     Cervical back: Normal range of motion and neck supple.  Skin:    General: Skin is warm and dry.  Neurological:     Mental Status: She is alert and oriented to person, place, and time. Mental status is at baseline.  Psychiatric:        Mood and Affect: Mood normal.        Behavior: Behavior normal.     UC Treatments / Results  Labs (all labs ordered are listed, but only abnormal results are displayed) Labs Reviewed  CULTURE, GROUP A STREP [facility])  POCT RAPID STREP A, ED / UC    EKG   Radiology No results found.  Procedures Procedures (including critical care time)  Medications Ordered in UC Medications - No data to display  Initial Impression / Assessment and Plan / UC Course  I have reviewed the triage vital signs and the nursing notes.  Pertinent labs & imaging results that were available during  my care of the patient were reviewed by me and considered in my medical decision making (see chart for details).  Viral  illness  1.  Rapid strep negative, sent for culture 2.  Flonase 50 mcg 1 spray each nare daily 3.  Mucinex 600 mg twice daily prn 4.  Over-the-counter medications and only may use for remaining symptoms, follow-up in urgent care as needed Final Clinical Impressions(s) / UC Diagnoses   Final diagnoses:  Viral illness     Discharge Instructions      Your rapid strep test today was negative, and will be sent to lab to determine if it grows back.  If this occurs you will be notified and an antibiotic will be sent to the pharmacy  You can take Flonase every morning to help with nasal congestion  You can use Mucinex twice a day to help thin out secretions    You can take Tylenol and/or Ibuprofen as needed for fever reduction and pain relief.   For cough: honey 1/2 to 1 teaspoon (you can dilute the honey in water or another fluid).  You can also use dextromethorphan for cough. You can use a humidifier for chest congestion and cough.  If you don't have a humidifier, you can sit in the bathroom with the hot shower running.      For sore throat: try warm salt water gargles, cepacol lozenges, throat spray, warm tea or water with lemon/honey, popsicles or ice, or OTC cold relief medicine for throat discomfort.   For congestion: take a daily anti-histamine like Zyrtec, Claritin, and a oral decongestant, such as pseudoephedrine.    It is important to stay hydrated: drink plenty of fluids (water, gatorade/powerade/pedialyte, juices, or teas) to keep your throat moisturized and help further relieve irritation/discomfort.     ED Prescriptions     Medication Sig Dispense Auth. Provider   fluticasone (FLONASE) 50 MCG/ACT nasal spray Place 1 spray into both nostrils daily. 11.1 mL Lajuan Kovaleski R, NP   guaiFENesin (MUCINEX) 600 MG 12 hr tablet Take 1 tablet (600 mg total) by mouth 2 (two) times daily. 30 tablet Valinda Hoar, NP      PDMP not reviewed this encounter.   Valinda Hoar,  NP 06/03/21 [phone]

## 2021-06-03 NOTE — ED Triage Notes (Signed)
Pt reports sore throat, congestion and right ear pain intermittently over past week.

## 2021-06-03 NOTE — Telephone Encounter (Signed)
Clinic notified by [employer] that pt called out of work today citing illness over weekend and seeing doctor today.  Spoke with pt by phone. She reports going to [facility] UC today. Notes reviewed.  Sore throat that started Day 0 9/27. By Friday had lost voice, had fatigue, body aches, nasal congestion, rhinorrhea. Voice still scratchy today but improved per pt. Denies fever, cough, chest congestion, loss of taste or smell, n/v/d.  Home covid test Saturday 10/1 was negative. Rapid strep at UC negative. Starting Flonase and Mucinex today.  Cleared by UC to RTW Tuesday 10/4. May RTW with strict mask use thru Day 10 10/7. Pt agreeable. Denies further questions or concerns.

## 2021-06-03 NOTE — Telephone Encounter (Signed)
Reviewed RN Rolly Salter note and [facility] UC provider note.  Agreed with plan of care.

## 2021-06-04 NOTE — Telephone Encounter (Signed)
Pt did RTW today 10/5 as expected. Closing encounter. 

## 2021-06-04 NOTE — Telephone Encounter (Signed)
Noted patient RTW as expected continue mask wear through 10/7 day 10.

## 2021-06-05 LAB — CULTURE, GROUP A STREP (THRC)

## 2021-07-29 ENCOUNTER — Telehealth: Payer: Self-pay | Admitting: Registered Nurse

## 2021-07-29 ENCOUNTER — Encounter: Payer: Self-pay | Admitting: Registered Nurse

## 2021-07-29 DIAGNOSIS — T39395A Adverse effect of other nonsteroidal anti-inflammatory drugs [NSAID], initial encounter: Secondary | ICD-10-CM

## 2021-07-29 DIAGNOSIS — N946 Dysmenorrhea, unspecified: Secondary | ICD-10-CM

## 2021-07-29 NOTE — Telephone Encounter (Signed)
Patient reported woke up with bad menstrual cramps this am.  Took NSAID on empty stomach and threw up so called in sick.  Patient did not want zofran at this time.  Discussed eat food before trying to take NSAID again.  HR notified personal medical and patient to use normal call out procedures tomorrow if continued symptoms.  No covid testing indicated at this time.  Patient verbalized understanding information/instructions, agreed with plan of care and had no further questions at this time.

## 2021-08-19 NOTE — Telephone Encounter (Signed)
Noted patient RTW 11/29 encounter closed.

## 2021-08-19 NOTE — Telephone Encounter (Signed)
Per HR Alexis, pt RTW next day 11/29. Closing encounter.

## 2021-09-16 ENCOUNTER — Telehealth: Payer: Self-pay | Admitting: *Deleted

## 2021-09-16 DIAGNOSIS — U071 COVID-19: Secondary | ICD-10-CM

## 2021-09-16 NOTE — Telephone Encounter (Signed)
Pt reported to HR positive covid test Friday night 1/13.  C/o sinus pressure, HA, infrequent cough, sneezing.  Sx started Fri 1/13   Day 0 1/13 Day 5 1/18 RTW 1/19 with strict mask thru Day 10 1/23  Increasing fluid intake with Gatorade, water. Taking acetaminophen/ibuprofen, and expectorant. Advised adding phenylephrine or Sudafed, Flonase, and nasal saline spray as sinus pressure and HA are her worst sx. Denies cloudy or yellow/green mucus, dental pain.  F/u 1/18 for sx check prior to scheduled RTW date 1/19.

## 2021-09-17 ENCOUNTER — Encounter: Payer: Self-pay | Admitting: *Deleted

## 2021-09-17 NOTE — Telephone Encounter (Signed)
Reviewed RN Rolly Salter note agreed with plan of care.  [employer]care handouts sent to patient my chart on home covid care and quarantine.

## 2021-09-18 NOTE — Telephone Encounter (Signed)
Patient contacted via telephone stated when she woke up a little short of breath today but that has resolved and feeling well now.  Denied wheezing/cough/mucous/n/v/d/fever or chills today.  Patient cleared to return to work tomorrow Day 6   Patient A&ox3 spoke full sentences without difficulty; no cough or throat clearing audible during 2 minute call some nasal congestion audible.  Patient notified to stop in clinic tomorrow for lung/sp02 check. HR notified cleared to return onsite tomorrow with strict mask wear through Monday 1/23 and no eating in employee lunch room.  Patient verbalized understanding information/instructions, agreed with plan of care and had no further questions at this time.

## 2021-09-19 NOTE — Telephone Encounter (Signed)
Pt did RTW today as expected. Denies any recurring ShOB. spO2 in clinic now 98% and bilateral lungs CTA. Pt feels well. Denies any needs or concerns. Mask in place. Strict mask use thru Day 10 1/23.

## 2021-09-21 NOTE — Telephone Encounter (Signed)
Reviewed RN Rolly Salter note sp02 stable 98% RA and BBS CTA.  Agreed with plan of care.

## 2021-10-07 NOTE — Telephone Encounter (Signed)
Patient left message feeling well all symptoms resolved.  Encounter closed.

## 2021-11-28 ENCOUNTER — Ambulatory Visit: Payer: Self-pay | Admitting: Registered Nurse

## 2021-11-28 ENCOUNTER — Encounter: Payer: Self-pay | Admitting: Registered Nurse

## 2021-11-28 VITALS — Resp 16

## 2021-11-28 DIAGNOSIS — M79645 Pain in left finger(s): Secondary | ICD-10-CM

## 2021-11-28 DIAGNOSIS — M62830 Muscle spasm of back: Secondary | ICD-10-CM

## 2021-11-28 DIAGNOSIS — M792 Neuralgia and neuritis, unspecified: Secondary | ICD-10-CM

## 2021-11-28 MED ORDER — IBUPROFEN 800 MG PO TABS: 800.0000 mg | ORAL_TABLET | Freq: Three times a day (TID) | ORAL | 0 refills | Status: AC | PRN

## 2021-11-28 MED ORDER — BIOFREEZE 4 % EX GEL: 1.0000 "application " | Freq: Four times a day (QID) | CUTANEOUS | 0 refills | Status: AC | PRN

## 2021-11-28 NOTE — Progress Notes (Addendum)
Subjective:    Patient ID: Megan [name], female    DOB: 05/02/1991, 31 y.o.   MRN: [id number]  31y/o caucasian female established patient with left neck/shoulder pain radiating down arm into fingers.  Feels like pulling sensation in her arm at times along with pain intermittent.   Denied weaknes/known injury/trauma/new activities/rash/bruising/swelling.  Sleeps on left side but pain keeps waking her up so now sleeping on her back or right side.  Had stopped seeing PCM years ago, no labs in years and stopped levothyroxine.  Denied holding phone in hand for extended periods of time.  Thumb muscle TTP left denied swelling/bruising/rash.  Works [employer] condition prior to purchase by employer.  Stopped vaping/smoking 2 months ago and using nicotine patches at this time.     Review of Systems  Constitutional:  Negative for activity change, appetite change, chills, diaphoresis, fatigue and fever.  HENT:  Negative for trouble swallowing and voice change.   Eyes:  Negative for photophobia and visual disturbance.  Respiratory:  Negative for cough.   Cardiovascular:  Negative for chest pain.  Gastrointestinal:  Negative for diarrhea, nausea and vomiting.  Endocrine: Negative for cold intolerance and heat intolerance.  Genitourinary:  Negative for difficulty urinating.  Musculoskeletal:  Positive for arthralgias, myalgias and neck pain. Negative for gait problem, joint swelling and neck stiffness.  Skin:  Negative for color change, pallor, rash and wound.  Allergic/Immunologic: Negative for food allergies.  Neurological:  Negative for dizziness, tremors, seizures, syncope, facial asymmetry, speech difficulty, weakness, light-headedness, numbness and headaches.  Hematological:  Negative for adenopathy. Does not bruise/bleed easily.  Psychiatric/Behavioral:  Positive for sleep disturbance. Negative for agitation and confusion.       Objective:    Physical Exam Vitals and nursing note reviewed.  Constitutional:      General: She is awake. She is not in acute distress.    Appearance: Normal appearance. She is well-developed, well-groomed and normal weight. She is not ill-appearing, toxic-appearing or diaphoretic.  HENT:     Head: Normocephalic and atraumatic.     Jaw: There is normal jaw occlusion.     Salivary Glands: Right salivary gland is not diffusely enlarged. Left salivary gland is not diffusely enlarged.     Right Ear: Hearing and external ear normal.     Left Ear: Hearing and external ear normal.     Nose: Nose normal. No congestion or rhinorrhea.     Mouth/Throat:     Lips: Pink. No lesions.     Mouth: Mucous membranes are moist. No oral lesions or angioedema.     Dentition: No gum lesions.     Pharynx: Oropharynx is clear. Uvula midline. No uvula swelling.  Eyes:     General: Lids are normal. Vision grossly intact. Gaze aligned appropriately. Allergic shiner present. No scleral icterus.       Right eye: No discharge.        Left eye: No discharge.     Extraocular Movements: Extraocular movements intact.     Conjunctiva/sclera: Conjunctivae normal.     Pupils: Pupils are equal, round, and reactive to light.  Neck:     Trachea: Trachea and phonation normal. No tracheal deviation.     Comments: Left trapezius tight/TTP trigger point along with left paraspinals cervical/thoracic/subscapular; able to touch chin to chest and equal AROM rotation/extension/flexion/lateral bending bilaterally; bilateral hand grasp equal 5/5; negative neers/atchley scratch/liftoff/empty beer can bilateral tests; denied pain with internal/external shoulder rotation/cross  body reach Cardiovascular:     Rate and Rhythm: Normal rate and regular rhythm.     Pulses: Normal pulses.          Radial pulses are 2+ on the right side and 2+ on the left side.  Pulmonary:     Effort: Pulmonary effort is normal. No respiratory distress.     Breath sounds:  Normal breath sounds and air entry. No stridor or transmitted upper airway sounds. No wheezing.     Comments: Spoke full sentences without difficulty; no cough observed in exam room Abdominal:     General: Abdomen is flat.  Musculoskeletal:        General: Tenderness present. No swelling, deformity or signs of injury. Normal range of motion.     Right shoulder: No swelling, deformity, effusion, laceration, tenderness, bony tenderness or crepitus. Normal range of motion. Normal strength.     Left shoulder: Tenderness present. No swelling, deformity, effusion, laceration, bony tenderness or crepitus. Normal range of motion. Normal strength.     Right upper arm: No swelling, edema, deformity, lacerations, tenderness or bony tenderness.     Left upper arm: No swelling, edema, deformity, lacerations, tenderness or bony tenderness.     Right elbow: No swelling, deformity, effusion or lacerations. Normal range of motion. No tenderness.     Left elbow: No swelling, deformity, effusion or lacerations. Normal range of motion. No tenderness.     Right forearm: No swelling, edema, deformity, lacerations, tenderness or bony tenderness.     Left forearm: No swelling, edema, deformity, lacerations, tenderness or bony tenderness.     Right wrist: No swelling, deformity, effusion, lacerations, tenderness, bony tenderness, snuff box tenderness or crepitus. Normal range of motion.     Left wrist: No swelling, deformity, effusion, lacerations, tenderness, bony tenderness, snuff box tenderness or crepitus. Normal range of motion.     Right hand: No swelling, deformity, lacerations, tenderness or bony tenderness. Normal range of motion. Normal strength. Normal sensation. Normal capillary refill.     Left hand: Tenderness present. No swelling, deformity or lacerations. Normal range of motion. Normal strength. Normal sensation. Normal capillary refill.     Cervical back: Normal range of motion and neck supple. Spasms and  tenderness present. No swelling, edema, deformity, erythema, signs of trauma, lacerations, rigidity, torticollis, bony tenderness or crepitus. Muscular tenderness present. No pain with movement or spinous process tenderness. Normal range of motion.     Thoracic back: Tenderness present. No swelling, edema, deformity, signs of trauma, lacerations, spasms or bony tenderness. Normal range of motion. No scoliosis.     Lumbar back: No swelling, edema, deformity, signs of trauma, lacerations, spasms or tenderness. Normal range of motion.       Back:     Right lower leg: No edema.     Left lower leg: No edema.     Comments: Left thumb thenar TTP anterior central to distal soft tissue; no snuff box tenderness; negative bilateral tinnels and phalen's tests; finkelsteins not performed  Lymphadenopathy:     Head:     Right side of head: No submandibular or preauricular adenopathy.     Left side of head: No submandibular or preauricular adenopathy.     Cervical: No cervical adenopathy.     Right cervical: No superficial cervical adenopathy.    Left cervical: No superficial cervical adenopathy.  Skin:    General: Skin is warm and dry.     Capillary Refill: Capillary refill takes less than 2 seconds.  Coloration: Skin is not ashen, cyanotic, jaundiced, mottled, pale or sallow.     Findings: No abrasion, bruising, burn, erythema, signs of injury, laceration, lesion, petechiae, rash or wound.  Neurological:     General: No focal deficit present.     Mental Status: She is alert and oriented to person, place, and time. Mental status is at baseline.     Cranial Nerves: No cranial nerve deficit.     Sensory: No sensory deficit.     Motor: Motor function is intact. No weakness, tremor, abnormal muscle tone or seizure activity.     Coordination: Coordination is intact. Coordination normal.     Gait: Gait is intact. Gait normal.     Comments: In/out of chair without difficulty; gait sure and steady in  clinic; bilateral hand grasp equal 5/5  Psychiatric:        Attention and Perception: Attention and perception normal.        Mood and Affect: Mood and affect normal.        Speech: Speech normal.        Behavior: Behavior normal. Behavior is cooperative.        Thought Content: Thought content normal.        Cognition and Memory: Cognition and memory normal.        Judgment: Judgment normal.     Applied cervical thermacare from clinic stock.  Given 4 UD biofreeze from clinic stock to utilize later discussed do not apply biofreeze then place thermacare over top immediately afterwards.  Apply a dose of thermacare later if needed for pain.  Fitted and distributed left thumb spica wrist splint from clinic stock size small to patient.  Discussed splint care hand wash, air dry do not put in washer/dryer.  Patient verbalized understanding information/instructions, agreed with plan of care and had no further questions at this time.     Assessment & Plan:  A-muscle spasms of back initial encounter, radicular pain left arm initial encounter, thumb pain acute initial encounter  P-Discussed muscle spasms most likely pinching nerve as it passes from spinal column to axilla causing radicular symptoms.  Trigger point found on exam today.  Patient has foam roller at home and will start using massage cane at home also.  If patient needs work restrictions will need to schedule with [facility]/PCM see RN Rolly Salter to schedule as I cannot write work restrictions in this clinic.  Ibuprofen 800mg  po TID prn pain #30 RF0 dispensed from [facility].   Take with food.  Has taken naproxen in the past without difficulty.   Home stretches demonstrated to patient-e.g. Arm circles, walking up wall, chest stretches, neck AROM, and eagle arms. Self massage or professional prn, foam roller use or lacrosse, golf,  tennis/racquetball.  Heat/cryotherapy 15 minutes QID prn.  Trial thermacare 1 applied and may return to clinic for another tomorrow from  RN Rolly Salter if today helpful from clinic stock.  Consider physical therapy referral if no improvement with prescribed therapy from [facility] and/or chiropractic care.  Ensure ergonomics correct desk at work avoid repetitive motions if possible/holding phone/laptop in hand use desk/stand and/or break up lifting items into smaller loads/weights. Hold loads of product close to torso when carrying/lifting. Patient was instructed to rest, ice, and ROM exercises.  Activity as tolerated.   Follow up if symptoms persist or worsen especially if loss of bowel/bladder control, arm/leg weakness and/or saddle paresthesias.  Consider labs exec panel as patient with history thyroid disease and after she quit seeing PCM/stopped  levothyroxine could be contributing to symptoms today also. Last labs 2017 per epic review.   Encouraged patient to get established with new PCM and have labs/follow up.   Exitcare handout on muscle spasms, radicular pain and rehab exercises printed and given to patient.  Patient verbalized agreement and understanding of treatment plan and had no further questions at this time.  P2:  Injury Prevention and Fitness.    Discussed most likely thumb strain will need some time to heal.  Wear brace/splint at work.  May take motrin 800mg  po TID prn pain.  Apply ice 15 minutes topical QID prn pain/swelling.  Exitcare handout thumb sprain printed and given to patient.  Follow up re-evaluation 2 weeks sooner if new or worsening symptoms.  Patient verbalized understanding information/instructions, agreed with plan of care and had no further questions at this time.

## 2021-11-28 NOTE — Patient Instructions (Addendum)
CARPAL TUNNEL (Nerve Compression Syndrome): Eagle Arms ? ? ? ?Bring right arm in front, elbow bent, forearm up. Reach left arm under right and, if possible, bring left fingers into right palm, thumbs facing body. (If not able to bring fingers into palm, just hold position where comfortable.) Hold position for __5_ breaths. Switch arms and repeat. ?Repeat __3_ times. Do _2__ times per day. ? ?Copyright ? [employer]. All rights reserved.  ?Thumb Sprain ?A thumb sprain is an injury to one of the bands of tissue that connect bones to each other (a ligament) in your thumb. The ligament may be stretched too much, or it may be torn. A tear can be either partial or complete. How bad, or severe, the sprain is depends on how much of the ligament was damaged or torn. ?What are the causes? ?A thumb sprain is often caused by a fall or an accident, such as when you hold your hands out to catch something or to protect yourself. ?What increases the risk? ?This injury is more likely to occur in people who play sports that involve: ?A risk of falling, such as skiing. ?Catching an object, such as basketball. ?What are the signs or symptoms? ?Symptoms of this condition include: ?Not being able to move the thumb normally. ?Swelling. ?Tenderness. ?Bruising. ?How is this diagnosed? ?This condition may be diagnosed based on: ?Your symptoms and medical history. Your health care provider may ask about any recent injuries to your thumb. ?A physical exam. ?Imaging studies, such as X-rays, ultrasound, or MRI. ?How is this treated? ?Treatment for this condition depends on how severe your sprain is. ?If your ligament is overstretched or partially torn, treatment usually involves keeping your thumb in a fixed position (immobilization) for at least 4 to 6 weeks. Your health care provider will apply a bandage (dressing), splint, brace, or cast to keep your thumb from moving until it heals. ?If your ligament is fully torn, you may need surgery to reconnect  the ligament to the bone. After surgery, you will need to wear a cast or splint on your thumb. ?Your health care provider may also recommend physical therapy to strengthen your thumb. ?Follow these instructions at home: ?If you have a removable splint, bandage, or brace: ?Wear the splint, bandage, or brace as told by your health care provider. Remove it only as told by your health care provider. ?Check the skin around the splint, bandage, or brace every day. Tell your health care provider about any concerns. ?Loosen the splint, bandage, or brace if your thumb or fingers tingle, become numb, or turn cold and blue. ?Keep it clean and dry. ?If you have a nonremovable cast: ?Do not put pressure on any part of the cast until it is fully hardened. This may take several hours. ?Do not stick anything inside the cast to scratch your skin. Doing that increases your risk of infection. ?Check the skin around the cast every day. Tell your health care provider about any concerns. ?You may put lotion on dry skin around the edges of the cast. Do not put lotion on the skin underneath the cast. ?Keep it clean and dry. ?Bathing ?Do not take baths, swim, or use a hot tub until your health care provider approves. Ask your health care provider if you may take showers. You may only be allowed to take sponge baths. ?If your splint, bandage, brace, or cast is not waterproof: ?Do not let it get wet. ?Cover it with a watertight covering when you take a  bath or shower. ?Managing pain, stiffness, and swelling ? ?If directed, put ice on your thumb. To do this: ?If you have a removable splint, bandage, or brace, remove it as told by your health care provider. ?Put ice in a plastic bag. ?Place a towel between your skin and the bag, or between your cast and the bag. ?Leave the ice on for 20 minutes, 2-3 times a day. ?Remove the ice if your skin turns bright red. This is very important. If you cannot feel pain, heat, or cold, you have a greater risk  of damage to the area. ?Move your fingers often to reduce stiffness and swelling. ?Raise (elevate) the injured area above the level of your heart while you are sitting or lying down. ?Activity ?Return to your normal activities as told by your health care provider. Ask your health care provider what activities are safe for you. ?Do physical therapy exercises as directed. After your splint, bandage, brace, or cast is removed, your health care provider may recommend that you: ?Move your thumb in circles. ?Touch your thumb to your pinky finger. ?Do these exercises several times a day. ?Ask your health care provider if you may use a hand exerciser to strengthen your muscles. ?If your thumb feels stiff while you are exercising it, try doing the exercises while soaking your hand in warm water. ?Driving ?Ask your health care provider when it is safe to drive if you have a splint, bandage, brace, or cast on your hand or thumb. ?Ask your health care provider if the medicine prescribed to you requires you to avoid driving or using machinery. ?General instructions ?Take over-the-counter and prescription medicines only as told by your health care provider. ?Do not use any products that contain nicotine or tobacco. These products include cigarettes, chewing tobacco, and vaping devices, such as e-cigarettes. These can delay bone healing. If you need help quitting, ask your health care provider. ?Do not wear rings on your injured thumb. ?Keep all follow-up visits. This is important. ?Contact a health care provider if: ?You have pain that gets worse or does not get better with medicine. ?You have bruising or swelling that gets worse. ?Your cast, brace, or splint is damaged. ?Get help right away if: ?Your thumb feels numb, tingles, turns cold, or turns blue, even after loosening your splint, bandage, or brace. ?Summary ?A thumb sprain is an injury to one of the bands of tissue that connect bones to each other (a ligament) in your  thumb. ?Thumb sprains are more likely to occur in people who play sports that involve a risk of falling or having to catch an object. ?Treatment will depend on how severe the sprain is, but it will require keeping the thumb in a fixed position (immobilization). It might require surgery. ?Make sure you understand and follow all of your health care provider's instructions for home care. ?This information is not intended to replace advice given to you by your health care provider. Make sure you discuss any questions you have with your health care provider. ?Document Revised: 07/11/2020 Document Reviewed: 07/11/2020 ?Elsevier Patient Education ? 2022 [employer] ?Radicular Pain ?Radicular pain is a type of pain that spreads from your back or neck along a spinal nerve. Spinal nerves are nerves that leave the spinal cord and go to the muscles. Radicular pain is sometimes called radiculopathy, radiculitis, or a pinched nerve. When you have this type of pain, you may also have weakness, numbness, or tingling in the area of your body  that is supplied by the nerve. The pain may feel sharp and burning. Depending on which spinal nerve is affected, the pain may occur in the: ?Neck area (cervical radicular pain). You may also feel pain, numbness, weakness, or tingling in the arms. ?Mid-spine area (thoracic radicular pain). You would feel this pain in the back and chest. This type is rare. ?Lower back area (lumbar radicular pain). You would feel this pain as low back pain. You may feel pain, numbness, weakness, or tingling in the buttocks or legs. Sciatica is a type of lumbar radicular pain that shoots down the back of the leg. ?Radicular pain occurs when one of the spinal nerves becomes irritated or squeezed (compressed). It is often caused by something pushing on a spinal nerve, such as one of the bones of the spine (vertebrae) or one of the round cushions between vertebrae (intervertebral disks). This can result from: ?An  injury. ?Wear and tear or aging of a disk. ?The growth of a bone spur that pushes on the nerve. ?Radicular pain often goes away when you follow instructions from your health care provider for relieving pai

## 2022-04-10 ENCOUNTER — Telehealth: Payer: Self-pay | Admitting: Registered Nurse

## 2022-04-10 ENCOUNTER — Encounter: Payer: Self-pay | Admitting: Registered Nurse

## 2022-04-10 NOTE — Telephone Encounter (Signed)
Notified by HR patient injury at work pallet jack ran over right foot 04/08/22.  Contacted patient for follow up appt with me.  She denied need for appt stated no injury.  Denied pain/bruising/swelling/cut.  HR notified encounter closed.

## 2022-04-21 ENCOUNTER — Encounter: Payer: Self-pay | Admitting: Registered Nurse

## 2022-04-21 ENCOUNTER — Telehealth: Payer: Self-pay | Admitting: Registered Nurse

## 2022-04-21 NOTE — Telephone Encounter (Signed)
Notified by HR Tonya possible work injury/bug bite on left thigh.  Notified RN Stone onsite today and she has evaluated patient/marked out rash and scheduled appt with me/NP tomorrow for re-evaluation.

## 2022-04-22 ENCOUNTER — Encounter: Payer: Self-pay | Admitting: Registered Nurse

## 2022-04-22 ENCOUNTER — Ambulatory Visit: Payer: Self-pay | Admitting: Registered Nurse

## 2022-04-22 VITALS — BP 113/82 | HR 78 | Temp 98.6°F | Resp 16

## 2022-04-22 DIAGNOSIS — L03116 Cellulitis of left lower limb: Secondary | ICD-10-CM

## 2022-04-22 DIAGNOSIS — S80862A Insect bite (nonvenomous), left lower leg, initial encounter: Secondary | ICD-10-CM

## 2022-04-22 MED ORDER — SULFAMETHOXAZOLE-TRIMETHOPRIM 800-160 MG PO TABS: 1.0000 | ORAL_TABLET | Freq: Two times a day (BID) | ORAL | 0 refills | Status: DC

## 2022-04-22 MED ORDER — TRIPLE ANTIBIOTIC 5-400-5000 EX OINT: TOPICAL_OINTMENT | Freq: Two times a day (BID) | CUTANEOUS | 0 refills | Status: AC | PRN

## 2022-04-22 NOTE — Patient Instructions (Signed)
Cellulitis, Adult  Cellulitis is a skin infection. The infected area is usually warm, red, swollen, and tender. This condition occurs most often in the arms and lower legs. The infection can travel to the muscles, blood, and underlying tissue and become serious. It is very important to get treated for this condition. What are the causes? Cellulitis is caused by bacteria. The bacteria enter through a break in the skin, such as a cut, burn, insect bite, open sore, or crack. What increases the risk? This condition is more likely to occur in people who: Have a weak body defense system (immune system). Have open wounds on the skin, such as cuts, burns, bites, and scrapes. Bacteria can enter the body through these open wounds. Are older than 30 years of age. Have diabetes. Have a type of long-lasting (chronic) liver disease (cirrhosis) or kidney disease. Are obese. Have a skin condition such as: Itchy rash (eczema). Slow movement of blood in the veins (venous stasis). Fluid buildup below the skin (edema). Have had radiation therapy. Use IV drugs. What are the signs or symptoms? Symptoms of this condition include: Redness, streaking, or spotting on the skin. Swollen area of the skin. Tenderness or pain when an area of the skin is touched. Warm skin. A fever. Chills. Blisters. How is this diagnosed? This condition is diagnosed based on a medical history and physical exam. You may also have tests, including: Blood tests. Imaging tests. How is this treated? Treatment for this condition may include: Medicines, such as antibiotic medicines or medicines to treat allergies (antihistamines). Supportive care, such as rest and application of cold or warm cloths (compresses) to the skin. Hospital care, if the condition is severe. The infection usually starts to get better within 1-2 days of treatment. Follow these instructions at home:  Medicines Take over-the-counter and prescription  medicines only as told by your health care provider. If you were prescribed an antibiotic medicine, take it as told by your health care provider. Do not stop taking the antibiotic even if you start to feel better. General instructions Drink enough fluid to keep your urine pale yellow. Do not touch or rub the infected area. Raise (elevate) the infected area above the level of your heart while you are sitting or lying down. Apply warm or cold compresses to the affected area as told by your health care provider. Keep all follow-up visits as told by your health care provider. This is important. These visits let your health care provider make sure a more serious infection is not developing. Contact a health care provider if: You have a fever. Your symptoms do not begin to improve within 1-2 days of starting treatment. Your bone or joint underneath the infected area becomes painful after the skin has healed. Your infection returns in the same area or another area. You notice a swollen bump in the infected area. You develop new symptoms. You have a general ill feeling (malaise) with muscle aches and pains. Get help right away if: Your symptoms get worse. You feel very sleepy. You develop vomiting or diarrhea that persists. You notice red streaks coming from the infected area. Your red area gets larger or turns dark in color. These symptoms may represent a serious problem that is an emergency. Do not wait to see if the symptoms will go away. Get medical help right away. Call your local emergency services (911 in the [place] Do not drive yourself to the hospital. Summary Cellulitis is a skin infection. This condition occurs most   often in the arms and lower legs. Treatment for this condition may include medicines, such as antibiotic medicines or antihistamines. Take over-the-counter and prescription medicines only as told by your health care provider. If you were prescribed an antibiotic medicine, do  not stop taking the antibiotic even if you start to feel better. Contact a health care provider if your symptoms do not begin to improve within 1-2 days of starting treatment or your symptoms get worse. Keep all follow-up visits as told by your health care provider. This is important. These visits let your health care provider make sure that a more serious infection is not developing. This information is not intended to replace advice given to you by your health care provider. Make sure you discuss any questions you have with your health care provider. Document Revised: 05/29/2021 Document Reviewed: 05/30/2021 Else[employer] Patient Education  [place], Adult An insect bite can make your skin red, itchy, and swollen. An insect bite is different from an insect sting, which happens when an insect injects poison (venom) into the skin. Some insects can spread disease to people through a bite. However, most insect bites do not lead to disease and are not serious. What are the causes? Insects may bite for a variety of reasons, including: Hunger. To defend themselves. Insects that bite include: Spiders. Mosquitoes and flies. Ticks and fleas. Ants. Kissing bugs. Chiggers. What are the signs or symptoms? In many cases, symptoms last for 2-4 days. However, itching can last up to 10 days. Symptoms include: Itching or pain in the bite area. Redness and swelling in the bite area. An open wound (skin ulcer). In rare cases, a person may have a severe allergic reaction (anaphylactic reaction) to a bite. Symptoms of an anaphylactic reaction may include: Feeling warm in the face (flushed). This may include redness. Itchy, red, swollen areas of skin (hives). Swelling of the eyes, lips, face, mouth, tongue, or throat. Wheezing or difficulty breathing, speaking, or swallowing. Dizziness, light-headedness, or fainting. Abdominal symptoms like cramping, nausea, vomiting, or diarrhea. How is  this diagnosed? This condition is usually diagnosed based on symptoms and a physical exam. During the exam, your health care provider will look at the bite and ask you what kind of insect bit you. How is this treated? Most insect bites are not serious. Symptoms often go away on their own and treatment is not usually needed. When treatment is recommended, it may include: Applying ice to the affected area. Applying steroid or other anti-itch creams, like calamine lotion, to the bite area. Medicines called antihistamines to reduce itching. You may also need: A tetanus shot if you are not up to date. Antibiotic cream or an oral antibiotic if the bite becomes infected (this is uncommon). Follow these instructions at home: Bite area care  Do not scratch the bite area. It may help to cover the bite area with a bandage or close-fitting clothing. Keep the bite area clean and dry. Wash it every day with soap and water as told by your health care provider. Check the bite area every day for signs of infection. Check for: More redness, swelling, or pain. Fluid or blood. Warmth. Pus or a bad smell. Managing pain, itching, and swelling  You may apply cortisone cream, calamine lotion, or a paste made of baking soda and water to the bite area as told by your health care provider. If directed, put ice on the bite area. To do this: Put ice in a plastic bag.  Place a towel between your skin and the bag. Leave the ice on for 20 minutes, 2-3 times a day. If your skin turns bright red, remove the ice right away to prevent skin damage. The risk of skin damage is higher if you cannot feel pain, heat, or cold. General instructions Apply or take over-the-counter and prescription medicine only as told by your health care provider. If you were prescribed antibiotics, take or apply them as told by your health care provider. Do not stop using the antibiotic even if you start to feel better. How is this prevented? To  help reduce your risk of insect bites: When you are outdoors, wear clothing that covers your arms and legs. This is especially important in the early morning and evening. Use insect repellent. The best insect repellents contain DEET, picaridin, oil of lemon eucalyptus (OLE), or IR3535. Consider spraying your clothing with a pesticide called permethrin. Permethrin helps prevent insect bites. It works for several weeks and for up to 5-6 clothing washes. Do not apply permethrin directly to the skin. If your home windows do not have screens, consider installing them. If you will be sleeping in an area where there are mosquitoes, consider covering your sleeping area with a mosquito net. Contact a health care provider if: Your bite area has signs of infection, such as: More redness, swelling, or pain. Fluid or blood. Warmth. Pus or a bad smell. You have a fever. Get help right away if: You have a rash. You have muscle or joint pain. You feel unusually tired or weak. You have neck pain or a headache. You develop symptoms of an anaphylactic reaction. These may include: Swelling of the eyes, lips, face, mouth, tongue, or throat. Flushed skin or hives. Wheezing. Difficulty breathing, speaking, or swallowing. Dizziness, light-headedness, or fainting. Abdominal pain, cramping, vomiting, or diarrhea. These symptoms may be an emergency. Get help right away. Call 911. Do not wait to see if the symptoms will go away. Do not drive yourself to the hospital. Summary An insect bite can make your skin red, itchy, and swollen. Treatment is usually not needed. Symptoms often go away on their own. When treatment is recommended, it may involve taking medicine, applying medicine to the area, or applying ice. Apply or take over-the-counter and prescription medicines only as told by your health care provider. Use insect repellent to help prevent insect bites. Contact a health care provider if your bite area has  signs of infection. This information is not intended to replace advice given to you by your health care provider. Make sure you discuss any questions you have with your health care provider. Document Revised: 11/27/2021 Document Reviewed: 11/12/2021 Else[employer] Patient Education  2023 [employer] Spider Bite Spider bites are not common. When spider bites do happen, most do not cause serious health problems. There are only a few types of spider bites that can cause serious health problems. What are the causes? A spider bite is often caused by a person accidentally making contact with a spider in a way that traps the spider against the skin. What increases the risk? You are more likely to be bitten by a spider if: You live in an area where spiders live, and you disturb their habitat. You work outdoors, such as a Clinical biochemist. You participate in certain outdoor activities, such as playing in leaves or hiking. What are the signs or symptoms? Symptoms may vary depending on the type of spider. Some spider bites may cause symptoms within  1 hour after the bite. For other spider bites, it may take 1-2 days for symptoms to develop. Common symptoms of this condition include: A raised area that is red. Redness and swelling around the area of the bite or bites. Discomfort or pain in the area of the bite. A few types of spiders, such as the black widow or the brown recluse, can inject poison (venom) into a bite wound. This venom causes more serious symptoms. Symptoms of a venomous spider bite vary and may include: Muscle cramps. Nausea, vomiting, or abdominal pain. A fever. A skin sore (lesion) that spreads. This can break into an open wound (skin ulcer). Light-headedness or dizziness. How is this diagnosed? This condition may be diagnosed based on your symptoms and a physical exam. Your health care provider will ask about the history of your injury and any details you may have about the spider.  This may help to determine what type of spider bit you. How is this treated? Many spider bites do not require treatment. If needed, this condition may be treated by: Icing and keeping the bite area raised (elevated). Taking or applying over-the-counter or prescription medicines to help control symptoms such as pain and itching. Having a tetanus shot, if needed. Taking antibiotic medicine. Follow these instructions at home: Medicines Take or apply over-the-counter and prescription medicines only as told by your health care provider. If you were prescribed an antibiotic medicine, take or apply it as told by your health care provider. Do not stop using the antibiotic even if you start to feel better or if your condition improves. Managing pain and swelling  If directed, put ice on the bite area. To do this: Put ice in a plastic bag. Place a towel between your skin and the bag. Leave the ice on for 20 minutes, 2-3 times a day. Remove the ice if your skin turns bright red. This is very important. If you cannot feel pain, heat, or cold, you have a greater risk of damage to the area. Elevate the bite area above the level of your heart while you are sitting or lying down. General instructions  Do not scratch the bite area. Keep the bite area clean and dry. Wash the bite area daily with soap and water as told by your health care provider. Keep all follow-up visits. This is important. Contact a health care provider if: Your bite does not get better after 3 days of treatment. Your bite turns black or purple. You have increased redness, swelling, or pain at the site of the bite. Get help right away if: You develop shortness of breath or chest pain. You have fluid, blood, or pus coming from the bite area. You have muscle cramps or painful muscle spasms. You develop abdominal pain, nausea, or vomiting. You feel unusually tired (fatigued) or sleepy. These symptoms may represent a serious problem  that is an emergency. Do not wait to see if the symptoms will go away. Get medical help right away. Call your local emergency services (911 in the [place] Do not drive yourself to the hospital. Summary Spider bites are not common. When spider bites do happen, most do not cause serious health problems. Take or apply over-the-counter and prescription medicines only as told by your health care provider. Keep the bite area clean and dry. Wash the bite area daily with soap and water as told by your health care provider. Contact a health care provider if you have increased redness, swelling, or pain at the  site of the bite. Get help right away if you develop shortness of breath or chest pain. This information is not intended to replace advice given to you by your health care provider. Make sure you discuss any questions you have with your health care provider. Document Revised: 06/06/2020 Document Reviewed: 06/06/2020 [employer] Patient Education  [place].

## 2022-04-22 NOTE — Progress Notes (Signed)
Subjective:    Patient ID: Megan [name], female    DOB: 1991/07/06, 31 y.o.   MRN: [id number]  31y/o caucasian female established patient here for evaluation red rash left thigh swelling painful ?bug bite did not see any insects on her 04/18/22 at work as no rash when getting dressed at home that am and when removed clothes after work noted rash/swelling and reported to supervisor 04/21/22; saw RN Stone yesterday 04/21/22 area marked out has enlarged slightly at medial and lateral edges faintly; denied itching/discharge/fever/chills/tingling/numbness/trouble walking.  No pain at rest but if touches affected area or weight bearing left leg 7/10 pain.  Using naproxen at home po prn per manufacturer instructions.  Has noticed lymph nodes in neck and groin swollen.      Review of Systems  Constitutional:  Negative for activity change, appetite change, chills, diaphoresis, fatigue and fever.  HENT:  Negative for trouble swallowing and voice change.   Eyes:  Negative for photophobia and visual disturbance.  Respiratory:  Negative for cough, choking, chest tightness, shortness of breath, wheezing and stridor.   Cardiovascular:  Positive for leg swelling. Negative for chest pain and palpitations.  Gastrointestinal:  Negative for diarrhea, nausea and vomiting.  Endocrine: Negative for cold intolerance and heat intolerance.  Genitourinary:  Negative for difficulty urinating.  Musculoskeletal:  Positive for gait problem and myalgias. Negative for arthralgias, back pain, joint swelling, neck pain and neck stiffness.  Skin:  Positive for color change, rash and wound. Negative for pallor.  Allergic/Immunologic: Positive for environmental allergies. Negative for food allergies.  Neurological:  Negative for dizziness, tremors, seizures, syncope, facial asymmetry, speech difficulty, weakness, light-headedness, numbness and headaches.  Hematological:  Negative for adenopathy. Does not bruise/bleed easily.   Psychiatric/Behavioral:  Negative for agitation, confusion and sleep disturbance.        Objective:   Physical Exam Vitals and nursing note reviewed.  Constitutional:      General: She is awake. She is not in acute distress.    Appearance: Normal appearance. She is well-developed, well-groomed and normal weight. She is not ill-appearing, toxic-appearing or diaphoretic.  HENT:     Head: Normocephalic and atraumatic.     Jaw: There is normal jaw occlusion. No swelling, pain on movement or malocclusion.     Salivary Glands: Right salivary gland is not diffusely enlarged or tender. Left salivary gland is not diffusely enlarged or tender.     Right Ear: Hearing and external ear normal.     Left Ear: Hearing and external ear normal.     Nose: Nose normal. No congestion or rhinorrhea.     Mouth/Throat:     Lips: Pink. No lesions.     Mouth: Mucous membranes are moist. No oral lesions or angioedema.     Dentition: No gum lesions.     Tongue: No lesions. Tongue does not deviate from midline.     Palate: No mass and lesions.     Pharynx: Oropharynx is clear. Uvula midline.  Eyes:     General: Lids are normal. Vision grossly intact. Gaze aligned appropriately. Allergic shiner present. No scleral icterus.       Right eye: No discharge.        Left eye: No discharge.     Extraocular Movements: Extraocular movements intact.     Conjunctiva/sclera: Conjunctivae normal.     Pupils: Pupils are equal, round, and reactive to light.  Neck:     Trachea: Trachea and phonation normal. No tracheal deviation.  Cardiovascular:     Rate and Rhythm: Normal rate and regular rhythm.     Pulses: Normal pulses.          Radial pulses are 2+ on the right side and 2+ on the left side.     Comments: No ankle edema bilateral Pulmonary:     Effort: Pulmonary effort is normal. No respiratory distress.     Breath sounds: Normal breath sounds and air entry. No stridor or transmitted upper airway sounds. No  wheezing, rhonchi or rales.     Comments: Spoke full sentences without difficulty; no cough observed in exam room Abdominal:     General: Abdomen is flat.  Musculoskeletal:        General: Swelling and tenderness present. Normal range of motion.     Right elbow: No swelling, deformity, effusion or lacerations.     Left elbow: No swelling, deformity, effusion or lacerations.     Right hand: No swelling or lacerations. Normal strength. Normal capillary refill.     Left hand: No swelling or lacerations. Normal strength. Normal capillary refill.     Cervical back: Normal range of motion and neck supple. No swelling, edema, deformity, erythema, signs of trauma, lacerations, rigidity, tenderness or crepitus. No pain with movement or muscular tenderness. Normal range of motion.     Thoracic back: No swelling, edema, deformity, signs of trauma or lacerations.     Right hip: No crepitus. Normal range of motion. Normal strength.     Left hip: No crepitus. Normal range of motion. Normal strength.     Right upper leg: No swelling, edema, deformity, lacerations or tenderness.     Left upper leg: Swelling, edema and tenderness present. No deformity or lacerations.     Right knee: No swelling, deformity, effusion, erythema, ecchymosis, lacerations or crepitus. Normal range of motion. No tenderness.     Left knee: No swelling, deformity, effusion, erythema, ecchymosis, lacerations or crepitus. Normal range of motion. No tenderness.     Right lower leg: No lacerations or tenderness. No edema.     Left lower leg: No lacerations or tenderness.     Right ankle: No swelling, deformity or ecchymosis.     Left ankle: No swelling, deformity or ecchymosis.       Legs:     Comments: Left anterior thigh 3 inches superior to knee macular erythema 2cm diameter irregular shaped nonpitting edema centrally firm no fluctuance TTP with ulcer ecchymosis purple no scab smaller than pencil head eraser dry; patient wearing jeans  with rips (fashion denied from accident) in them adjusted rip for evaluation of rash on thigh  Lymphadenopathy:     Head:     Right side of head: No submental, submandibular, tonsillar, preauricular, posterior auricular or occipital adenopathy.     Left side of head: No submental, submandibular, tonsillar, preauricular, posterior auricular or occipital adenopathy.     Cervical: No cervical adenopathy.     Right cervical: No superficial, deep or posterior cervical adenopathy.    Left cervical: No superficial, deep or posterior cervical adenopathy.     Comments: Deferred inguinal exam as chaperone not available  Skin:    General: Skin is warm and dry.     Capillary Refill: Capillary refill takes less than 2 seconds.     Coloration: Skin is not ashen, cyanotic, jaundiced, mottled, pale or sallow.     Findings: Ecchymosis, erythema and rash present. No abrasion, abscess, acne, bruising, burn, signs of injury, laceration, lesion, petechiae or  wound. Rash is macular. Rash is not crusting, nodular, purpuric, pustular, scaling, urticarial or vesicular.          Comments: Induration hard central 1cm diameter of 2cm diameter irregularly shaped macular erythema did not blanch left anterior thigh; dry without discharge or fluctuance; shallow nummular ulcer less than pencil head eraser diameter with ecchymosis at center of rash  Neurological:     General: No focal deficit present.     Mental Status: She is alert and oriented to person, place, and time. Mental status is at baseline.     GCS: GCS eye subscore is 4. GCS verbal subscore is 5. GCS motor subscore is 6.     Cranial Nerves: Cranial nerves 2-12 are intact. No cranial nerve deficit, dysarthria or facial asymmetry.     Sensory: Sensation is intact.     Motor: Motor function is intact. No weakness, tremor, atrophy, abnormal muscle tone or seizure activity.     Coordination: Coordination is intact. Coordination normal.     Gait: Gait is intact. Gait  normal.     Comments: In/out of chair without difficulty; gait sure and steady in clinic; bilateral hand grasp equal 5/5  Psychiatric:        Attention and Perception: Attention and perception normal.        Mood and Affect: Mood and affect normal.        Speech: Speech normal.        Behavior: Behavior normal. Behavior is cooperative.        Thought Content: Thought content normal.        Cognition and Memory: Cognition and memory normal.        Judgment: Judgment normal.           Assessment & Plan:   A-insect bite of left lower extremity initial encounter, cellulitis left leg  P-[employer]care handouts on cellulitis/spider bite/insect bite printed and given to patient.  Cleansed affected area with first aid spray applied triple antibiotic ointment and covered with elastic bandaid as noted ripped jeans rubbing affected area.  Discussed with patient to keep covered until healed.  RTC in 48 hours for re-evaluation with NP at 1100 24 Aug.  Notify NP/clinic staff  if worsening erythema (streaks up leg), pain, purulent discharge, or fever. Start bactrim DS po BID x 7 days #40 RF0 dispensed from [facility] to patient today.  May use naproxen up to 500mg  po BID OTC per patient preference has at home.  Discussed may apply ice 15 minutes QID prn pain/swelling.  May also use tylenol 1000mg  po q6h prn pain.  Apply triple antibiotic daily to BID with dressing change.  Cover with bandage until healed over.  Given bandaids and triple antibiotic ointment from clinic stock.   Avoid scratching.  Discussed with patient characteristics consistent with spider bite.  If patient needs work restrictions will send to [facility] for further evaluation as unable to write work restrictions in this clinic due to contract limitations.  [employer] notified patient did not want work restrictions today/re-evaluation in 48 hours with NP.  Patient verbalized understanding, agreed with plan of care and had no further questions at this  time.

## 2022-04-24 ENCOUNTER — Ambulatory Visit: Payer: Self-pay | Admitting: Registered Nurse

## 2022-04-24 DIAGNOSIS — R509 Fever, unspecified: Secondary | ICD-10-CM

## 2022-04-24 DIAGNOSIS — L03116 Cellulitis of left lower limb: Secondary | ICD-10-CM

## 2022-04-24 NOTE — Progress Notes (Signed)
Subjective:    Patient ID: Megan [name], female    DOB: 1990/11/10, 31 y.o.   MRN: [id number]  31y/o caucasian female established patient requested re-evaluation due to fever s/p bug bite/cellulitis left thigh started on bactrim DS 22 Aug 23.  Wound had a little bit of discharge today white on bandaid gauze.  Denied red streaks up thigh/swelling/bleeding/bruising.  Has a few pimples around ulcer today.  Has taken 5 doses bactrim ds so far didn't go to work yesterday due to still having pain with weight bearing left leg.  Today awoke with fever thought it was side effect of antibiotics.  Some mild nausea and headache also.  Neck and shoulder sore.    Denied sick contacts.  Does not have home covid tests available at home.  Stated puffiness around bug bite decreased today, pain decreased and lymph node swelling has decresed today.  Rates headache 2/10.  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  Patient consented to telephone/video visit.  This visit was conducted entirely via telephone.  I spent 20 minutes on the telephone with patient.  Patient was unable to connect via video  The visit was completed via audio only. Location of patient home; location of provider [facility]      Review of Systems  Constitutional:  Positive for fever. Negative for activity change, appetite change, chills, diaphoresis and fatigue.  HENT:  Positive for congestion. Negative for trouble swallowing and voice change.   Eyes:  Negative for photophobia, pain, discharge, redness, itching and visual disturbance.  Respiratory:  Negative for cough, shortness of breath, wheezing and stridor.   Cardiovascular:  Negative for chest pain and leg swelling.  Gastrointestinal:  Positive for nausea. Negative for abdominal distention, abdominal pain, blood in stool, constipation, diarrhea and  vomiting.  Endocrine: Negative for cold intolerance and heat intolerance.  Genitourinary:  Negative for difficulty urinating.  Musculoskeletal:  Positive for arthralgias, gait problem, myalgias and neck pain. Negative for back pain, joint swelling and neck stiffness.  Skin:  Positive for color change, rash and wound. Negative for pallor.  Allergic/Immunologic: Negative for food allergies.  Neurological:  Positive for headaches. Negative for dizziness, tremors, seizures, syncope, facial asymmetry, speech difficulty, weakness, light-headedness and numbness.  Hematological:  Negative for adenopathy. Does not bruise/bleed easily.  Psychiatric/Behavioral:  Negative for agitation, confusion and sleep disturbance.        Objective:   Physical Exam Nursing note reviewed.  Constitutional:      General: She is awake. She is not in acute distress. HENT:     Nose: No congestion.  Pulmonary:     Effort: Pulmonary effort is normal.     Breath sounds: Normal breath sounds and air entry.     Comments: Spoke full sentences without difficulty; no cough audible or throat clearing/nasal sniffing during 20 minute telephone call Skin:    Findings: Erythema and rash present.  Neurological:     Mental Status: She is alert and oriented to person, place, and time. Mental status is at baseline.     Cranial Nerves: No dysarthria.  Psychiatric:        Mood and Affect: Mood normal.        Behavior: Behavior normal. Behavior is cooperative.        Thought Content: Thought content normal.  Judgment: Judgment normal.       Patient sent image of wound see upload to epic    Assessment & Plan:   A-cellulitis left lower extremity, fever  P-continue plan of care as previously discussed for cellulitis see office visit dated 04/22/22 bactrim DS po BID, wash daily with soap and water and change dressing prn soiling apply triple antibiotic with dressing change.  Re-evaluation 04/29/22 with NP to determine if  bactrim DS course needs to be extended past 7 days. See urgent care/ER provider if red streaks up thigh to groin as may need IV antibiotics/incision and drainage of lesion.   Patient verbalized understanding information/instructions, agreed with plan of care and had no further questions at this time.  Covid increasing in the community, hospitalization rose 20% in the past week.  Home covid test today and if new URI symptoms/negative today retest in 48 hours.  Discussed with patient with cellulitis symptoms improving do not expect fever to be occurring but may occur with cellulitis.  Patient asking if okay to take tylenol/nsaid or if better to let body have fever to fight off infection.  Discussed with patient if 102 or higher fever I do recommend hydrating with water/meds.  If low grade 100.5 consider hydrating/rest only.  Contact NP if new or worsening symptoms for re-evaluation.  May return to work tomorrow if afebrile all afternoon/tonight/am e.g. less than 100.5 per employer policy.  Discussed if diarrhea/vomiting in previous 24 hours employer also prohibits employee onsite.    Patient verbalized understanding information/instructions, agreed with plan of care and had no further questions at this time.

## 2022-04-24 NOTE — Patient Instructions (Signed)
Fever, Adult     A fever is an increase in the body's temperature. It is usually defined as a temperature of 100.4F (38C) or higher. Brief mild or moderate fevers generally have no long-term effects, and they often do not need treatment. Moderate or high fevers may make you feel uncomfortable and can sometimes be a sign of a serious illness or disease. The sweating that may occur with repeated or prolonged fever may also cause a loss of fluid in the body (dehydration). Fever is confirmed by taking a temperature with a thermometer. A measured temperature can vary with: Age. Time of day. Where in the body you take the temperature. Readings may vary if you place the thermometer: In the mouth (oral). In the rectum (rectal). In the ear (tympanic). Under the arm (axillary). On the forehead (temporal). Follow these instructions at home: Medicines Take over-the counter and prescription medicines only as told by your health care provider. Follow the dosing instructions carefully. If you were prescribed an antibiotic medicine, take it as told by your health care provider. Do not stop taking the antibiotic even if you start to feel better. General instructions Watch your condition for any changes. Let your health care provider know about them. Rest as needed. Drink enough fluid to keep your urine pale yellow. This helps to prevent dehydration. Sponge yourself or bathe with room-temperature water to help reduce your body temperature as needed. Do not use ice water. Do not use too many blankets or wear clothes that are too heavy. If your fever may be caused by an infection that spreads from person to person (is contagious), such as a cold or the flu, you should stay home from work and public gatherings for at least 24 hours after your fever is gone. Your fever should be gone without the need to use medicines. Contact a health care provider if: You vomit. You cannot eat or drink without vomiting. You  have diarrhea. You have pain when you urinate. Your symptoms do not improve with treatment. You develop new symptoms. You develop excessive weakness. Get help right away if: You have shortness of breath or have trouble breathing. You are dizzy or you faint. You are disoriented or confused. You develop signs of dehydration, such as: Dark urine, very little urine, or no urine. Cracked lips. Dry mouth. Sunken eyes. Sleepiness. Weakness. You develop severe pain in your abdomen. You have persistent vomiting or diarrhea. You develop a skin rash. Your symptoms suddenly get worse. Summary A fever is an increase in the body's temperature. It is usually defined as a temperature of 100.4F (38C) or higher. Moderate or high fevers can sometimes be a sign of a serious illness or disease. The sweating that may occur with repeated or prolonged fever may also cause dehydration. Pay attention to any changes in your symptoms and contact your health care provider if your symptoms do not improve with treatment. Take over-the counter and prescription medicines only as told by your health care provider. Follow the dosing instructions carefully. If your fever is from an infection that may be contagious, such as cold or flu, you should stay home from work and public gatherings for at least 24 hours after your fever is gone. Your fever should be gone without the need to use medicines. Get help right away if you develop signs of dehydration, such as dark urine, cracked lips, dry mouth, sunken eyes, sleepiness, or weakness. This information is not intended to replace advice given to you by   your health care provider. Make sure you discuss any questions you have with your health care provider. Document Revised: 12/16/2021 Document Reviewed: 01/08/2021 Elsevier Patient Education  2023 [employer]  

## 2022-04-25 ENCOUNTER — Ambulatory Visit: Payer: Self-pay

## 2022-04-25 NOTE — Telephone Encounter (Signed)
Patient contacted via telephone had telephone visit follow up yesterday and reported fever but negative covid test at home along with nausea/shoulder pain/headache.  Patient at work today temp was 80F when she woke up today.  99.84F when RN Stone checked her in clinic this am.  Still having mild headache.  No enlargement of pimples/redness on leg maybe improved some, area around ulcer still soft not hard like earlier in the week.  Nausea resolved.  Scant discharge from leg wound and denied new redness/swelling/pain.  Discussed with patient if red streaks, worsening pain/swelling/discharge to contact me this weekend.  If worsening headache/fever, return n/v or new diarrhea, URI symptoms repeat covid test again tomorrow at home.  Hydrate with water and eat regular meals/avoid skipping meals.  Patient A&Ox3, spoke full sentences without difficulty, verbalized understanding information/instructions, agreed with plan of care and had no further questions at this time.

## 2022-04-27 NOTE — Telephone Encounter (Signed)
Patient contacted via telephone.  Stated she retook covid test negative.  She was still having headache and noticed yesterday worsened after taking bactrim ds about 2 hours later.  Talked with friend who had similar problem.  Took her last dose this am.  Discussed with patient dehydration, infection and/or medication side effect could be causing her symptoms.  Pop up thunderstorms/barometric pressure changes have also occurred in the past week and forecasted for later today.  Has been applying neosporin to leg wound.  Stated discharge has remained scant, not enlarging, bumps around edges still remain.  Tissue still soft not hard again.  Last known fever Thursday 03/24/22.  Has not taken any pain medication today.  Covering wound with bandaids.  Discussed washing affected area with soap and water at least daily and changing bandaids daily then prn soiling.  Patient to notify clinic staff if new or worsening redness/pain/discharge.  Discussed may need different antibiotic if those symptoms occur.  Still having mild headache at this time but much improved from the previous week.   Encouraged her to continue hydrating as fever earlier this week and heat index days greater than 100 may have contributed to mild dehydration.  She feels able to work tomorrow.  Discussed blood pressure was good on recheck with RN Larina Bras Friday 04/25/22 and I do not think that was cause of her headaches.  Patient verbalized understanding information/instructions, agreed with plan of care and had no further questions at this time.  Duration of call 4 minutes patient A&Ox3 spoke full sentences without difficulty; no audible congestion/throat clearing/cough.

## 2022-04-28 ENCOUNTER — Telehealth: Payer: Self-pay | Admitting: Registered Nurse

## 2022-04-28 ENCOUNTER — Encounter: Payer: Self-pay | Admitting: Registered Nurse

## 2022-04-28 ENCOUNTER — Ambulatory Visit
Admission: EM | Admit: 2022-04-28 | Discharge: 2022-04-28 | Disposition: A | Discharge status: Home / Self Care | Payer: No Typology Code available for payment source | Attending: Urgent Care | Admitting: Urgent Care

## 2022-04-28 DIAGNOSIS — S81802A Unspecified open wound, left lower leg, initial encounter: Secondary | ICD-10-CM | POA: Diagnosis not present

## 2022-04-28 DIAGNOSIS — J019 Acute sinusitis, unspecified: Secondary | ICD-10-CM

## 2022-04-28 DIAGNOSIS — L03116 Cellulitis of left lower limb: Secondary | ICD-10-CM

## 2022-04-28 MED ORDER — PSEUDOEPHEDRINE HCL 30 MG PO TABS: 30.0000 mg | ORAL_TABLET | Freq: Three times a day (TID) | ORAL | 0 refills | Status: AC | PRN

## 2022-04-28 MED ORDER — AMOXICILLIN-POT CLAVULANATE 875-125 MG PO TABS: 1.0000 | ORAL_TABLET | Freq: Two times a day (BID) | ORAL | 0 refills | Status: AC

## 2022-04-28 MED ORDER — CETIRIZINE HCL 10 MG PO TABS: 10.0000 mg | ORAL_TABLET | Freq: Every day | ORAL | 0 refills | Status: AC

## 2022-04-28 NOTE — ED Provider Notes (Signed)
[facility]   MRN: [id number] DOB: 03/30/1991  Subjective:   Megan [name] is a 31 y.o. female presenting for wound check and persistent fevers.  Patient suffered a bite to the left leg.  She was seen and treated for that with Bactrim and had significant response with that.  Her redness and pain decreased.  She is still managing the wound.  Her primary concern is that she continues to have fevers.  She is also had left-sided facial pain, ear popping of the right side.  She discussed this with her provider and ended up getting a prescription of Augmentin but she has not started taking it yet.  No current facility-administered medications for this encounter.  Current Outpatient Medications:    amoxicillin-clavulanate (AUGMENTIN) 875-125 MG tablet, Take 1 tablet by mouth every 12 (twelve) hours for 10 days., Disp: 20 tablet, Rfl: 0   fluticasone (FLONASE) 50 MCG/ACT nasal spray, Place 1 spray into both nostrils daily., Disp: 11.1 mL, Rfl: 1   neomycin-bacitracin-polymyxin (NEOSPORIN) [id number] ointment, Apply topically 2 (two) times daily as needed for up to 14 days., Disp: 28.3 g, Rfl: 0   Allergies  Allergen Reactions   Bactrim Ds [Sulfamethoxazole-Trimethoprim] Nausea Only    Along with severe headache approximately 2 hours after dose with every dose   Asa [Aspirin] Other (See Comments)    *childhood allergy* reaction unknown    Codeine    Latex Itching and Rash    Past Medical History:  Diagnosis Date   Anemia    Anxiety    Depression    Ehlers-Danlos disease    Low blood pressure    Marfan syndrome    Mitral valve regurgitation    Thyroid disease      Past Surgical History:  Procedure Laterality Date   MOLE REMOVAL     back    Family History  Problem Relation Age of Onset   Cancer Maternal Grandmother    Diabetes Maternal Grandmother    Breast cancer Maternal Grandmother    Diabetes Maternal Grandfather    Cancer Paternal Grandmother     Breast cancer Paternal Grandmother    Cancer Paternal Grandfather    Thyroid disease Mother    Healthy Father     Social History   Tobacco Use   Smoking status: Every Day    Packs/day: 0.25    Years: 1.00    Total pack years: 0.25    Types: E-cigarettes, Cigarettes   Smokeless tobacco: Never   Tobacco comments:    Smoked for 1 year  Substance Use Topics   Alcohol use: Yes    Alcohol/week: 2.0 standard drinks of alcohol    Types: 2 Cans of beer per week   Drug use: No    ROS   Objective:   Vitals: BP 112/76 (BP Location: Left Arm)   Pulse 95   Temp (!) 100.7 F (38.2 C)   Resp 16   LMP 04/14/2022 (Exact Date)   SpO2 100%   Physical Exam Constitutional:      General: She is not in acute distress.    Appearance: Normal appearance. She is well-developed. She is not ill-appearing, toxic-appearing or diaphoretic.  HENT:     Head: Normocephalic and atraumatic.     Nose: Nose normal.     Mouth/Throat:     Mouth: Mucous membranes are moist.  Eyes:     General: No scleral icterus.       Right eye: No discharge.  Left eye: No discharge.     Extraocular Movements: Extraocular movements intact.  Neck:     Meningeal: Brudzinski's sign and Kernig's sign absent.  Cardiovascular:     Rate and Rhythm: Normal rate.  Pulmonary:     Effort: Pulmonary effort is normal.  Skin:    General: Skin is warm and dry.       Neurological:     General: No focal deficit present.     Mental Status: She is alert and oriented to person, place, and time.     Cranial Nerves: No cranial nerve deficit, dysarthria or facial asymmetry.     Motor: No weakness or pronator drift.     Coordination: Romberg sign negative. Coordination normal. Finger-Nose-Finger Test and Heel to [facility] Test normal. Rapid alternating movements normal.     Gait: Gait and tandem walk normal.     Deep Tendon Reflexes: Reflexes normal.  Psychiatric:        Mood and Affect: Mood normal.        Behavior:  Behavior normal.        Thought Content: Thought content normal.        Judgment: Judgment normal.     Assessment and Plan :   PDMP not reviewed this encounter.  1. Acute non-recurrent sinusitis, unspecified location   2. Wound of left lower extremity, initial encounter     Suspect patient has sinusitis and therefore would benefit from Augmentin.  Use Zyrtec and pseudoephedrine together with this.  Continue with wound care as discussed with her own provider. Counseled patient on potential for adverse effects with medications prescribed/recommended today, ER and return-to-clinic precautions discussed, patient verbalized understanding.    Wallis Bamberg, [place] 04/28/22 1824

## 2022-04-28 NOTE — Telephone Encounter (Signed)
See tcon dated 04/28/22 patient started on augmentin 875mg  po BID dispensed from [facility].  Re-evaluate tomorrow.  T100.7 in clinic this am with RN Stone.

## 2022-04-28 NOTE — ED Triage Notes (Signed)
Pt. C/o spider bite that happened over a week and a half ago. Stated she was treated at a [place] facility. They gave her antibiotics that she started last Tuesday and ever since she has had fevers, neck pain and shoulder pain.

## 2022-04-28 NOTE — Telephone Encounter (Signed)
Patient went to [facility].  Evaluated by RN Oda Cogan.  T100.9F tympanic today.  Distal edge wound indurated/firm per RN on evaluation.  See picture.  Patient denied worsening of discharge/pain/expanding redness.  Erythema appears improved per picture still some satellite papules noted and shallow ulcer central to cellulitis erythema surrounding anterior left thigh.  Discussed with patient and RN due to fever/induration resuming I recommend augmentin 875mg  po BID x 10 days #20 RF0 dispensed from [facility] to patient.  Medication information sent to patient via my chart.  I will re-evaluate patient myself when onsite tomorrow.  Patient given bandaids from clinic stock by RN Stone.  Discussed current plan to take for 7 days and re-evaluate 5 Sep to determine if longer course augmentin required at that time.  Discussed with patient 10 days of twice a day dosing in bottle.  Patient verbalized understanding information/instructions, agreed with plan of care and had no further questions at this time.  HR Tonya asked if patient to be sent home/urgent care discussed skin infection worsening antibiotic changed.  Patient does need to establish with a PCP office but no further needs required today unless patient needs work restrictions/unable to perform job duties.

## 2022-04-29 NOTE — Telephone Encounter (Signed)
Epic reviewed was seen at urgent care given work excuse  April 28, 2022 To Whom It May Concern: Megan [name] was seen and treated in our Urgent Care on 04/28/2022 She may return to work on 04/30/2022. Wallis Bamberg, PA-C  Instructed to start augmentin 875mg  po BID for sinusitis.  Consider zyrtec and pseudoephedrine oral use also.  Will follow up with patient 05/01/22 when onsite again.
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
## 2022-08-18 ENCOUNTER — Ambulatory Visit: Payer: Self-pay | Admitting: Occupational Medicine

## 2022-08-18 VITALS — BP 122/94 | HR 89

## 2022-08-18 DIAGNOSIS — R079 Chest pain, unspecified: Secondary | ICD-10-CM

## 2022-08-18 NOTE — Progress Notes (Signed)
Patient reports left upper chest pain dull aches occasional stab 4-5/10. Feels like when she gets anxiety. Reports no shob, no nv, no neck pain numbness or tingling. Pt states has anxiety on and off. No medication for it. Educated if Cp doesn't go away, changes or worsen need to go to [facility] or ER. Given info on finding PCP. Also talked about her scheduling appt with behavioral tel doc for the anxiety.

## 2022-09-02 ENCOUNTER — Ambulatory Visit
Admission: EM | Admit: 2022-09-02 | Discharge: 2022-09-02 | Disposition: A | Discharge status: Home / Self Care | Payer: No Typology Code available for payment source | Attending: Family Medicine | Admitting: Family Medicine

## 2022-09-02 DIAGNOSIS — J0100 Acute maxillary sinusitis, unspecified: Secondary | ICD-10-CM | POA: Diagnosis not present

## 2022-09-02 DIAGNOSIS — J069 Acute upper respiratory infection, unspecified: Secondary | ICD-10-CM | POA: Diagnosis not present

## 2022-09-02 MED ORDER — CEFDINIR 300 MG PO CAPS: 600.0000 mg | ORAL_CAPSULE | Freq: Every day | ORAL | 0 refills | Status: AC

## 2022-09-02 MED ORDER — BENZONATATE 100 MG PO CAPS: 100.0000 mg | ORAL_CAPSULE | Freq: Three times a day (TID) | ORAL | 0 refills | Status: AC | PRN

## 2022-09-02 NOTE — ED Triage Notes (Signed)
Pt c/o cough, sinus congestion, sore throat, HA, left ear feels muffled sx started 6 days ago-NAD-steady gait

## 2022-09-02 NOTE — Discharge Instructions (Signed)
Take cefdinir 300 mg--2 capsules together daily for 7 days  Take benzonatate 100 mg, 1 tab every 8 hours as needed for cough.   

## 2022-09-02 NOTE — ED Provider Notes (Signed)
[facility]    CSN: [id number] Arrival date & time: 09/02/22  1211      History   Chief Complaint Chief Complaint  Patient presents with   Cough    Sore throat, cough, ear ache on left side - Entered by patient    HPI Megan [name] is a 32 y.o. female.    Cough  Here for cough and nasal congestion and rhinorrhea.  Symptoms began on December 27.  She has had decreased appetite and has had some myalgia and malaise.  No fever or chills.  Her throat is also been sore in her left ear has had a little pressure and has been muffled.  She has had a good bit of sinus pressure developing and these last 2 days.   This menstrual cycle was December 12.  Past Medical History:  Diagnosis Date   Anemia    Anxiety    Depression    Ehlers-Danlos disease    Low blood pressure    Marfan syndrome    Mitral valve regurgitation    Thyroid disease     Patient Active Problem List   Diagnosis Date Noted   Routine general medical examination at a health care facility 10/08/2015   Anemia    Anxiety    Depression    Thyroid disease    Marfan syndrome    Low blood pressure    Ehlers-Danlos disease     Past Surgical History:  Procedure Laterality Date   MOLE REMOVAL     back    OB History     Gravida  0   Para  0   Term  0   Preterm  0   AB  0   Living  0      SAB  0   IAB  0   Ectopic  0   Multiple  0   Live Births               Home Medications    Prior to Admission medications   Medication Sig Start Date End Date Taking? Authorizing Provider  benzonatate (TESSALON) 100 MG capsule Take 1 capsule (100 mg total) by mouth 3 (three) times daily as needed for cough. 09/02/22  Yes Barrett Henle, MD  cefdinir (OMNICEF) 300 MG capsule Take 2 capsules (600 mg total) by mouth daily for 7 days. 09/02/22 09/09/22 Yes Barrett Henle, MD  cetirizine (ZYRTEC ALLERGY) 10 MG tablet Take 1 tablet (10 mg total) by mouth daily. 04/28/22   Jaynee Eagles,  PA-C  fluticasone (FLONASE) 50 MCG/ACT nasal spray Place 1 spray into both nostrils daily. 06/03/21   White, Leitha Schuller, NP  pseudoephedrine (SUDAFED) 30 MG tablet Take 1 tablet (30 mg total) by mouth every 8 (eight) hours as needed for congestion. 04/28/22   Jaynee Eagles, PA-C    Family History Family History  Problem Relation Age of Onset   Cancer Maternal Grandmother    Diabetes Maternal Grandmother    Breast cancer Maternal Grandmother    Diabetes Maternal Grandfather    Cancer Paternal Grandmother    Breast cancer Paternal Grandmother    Cancer Paternal Grandfather    Thyroid disease Mother    Healthy Father     Social History Social History   Tobacco Use   Smoking status: Former    Packs/day: 0.25    Years: 1.00    Total pack years: 0.25    Types: E-cigarettes, Cigarettes   Smokeless tobacco: Never  Tobacco comments:    Smoked for 1 year  Vaping Use   Vaping Use: Former  Substance Use Topics   Alcohol use: Yes    Comment: weekly   Drug use: No     Allergies   Bactrim ds [sulfamethoxazole-trimethoprim], Asa [aspirin], Codeine, and Latex   Review of Systems Review of Systems  Respiratory:  Positive for cough.      Physical Exam Triage Vital Signs ED Triage Vitals  Enc Vitals Group     BP 09/02/22 1217 129/79     Pulse Rate 09/02/22 1217 (!) 106     Resp 09/02/22 1217 18     Temp 09/02/22 1217 99.1 F (37.3 C)     Temp Source 09/02/22 1217 Oral     SpO2 09/02/22 1217 98 %     Weight --      Height --      Head Circumference --      Peak Flow --      Pain Score 09/02/22 1222 5     Pain Loc --      Pain Edu? --      Excl. in [place]? --    No data found.  Updated Vital Signs BP 129/79 (BP Location: Right Arm)   Pulse (!) 106   Temp 99.1 F (37.3 C) (Oral)   Resp 18   LMP 08/12/2022 (Approximate)   SpO2 98%   Visual Acuity Right Eye Distance:   Left Eye Distance:   Bilateral Distance:    Right Eye Near:   Left Eye Near:    Bilateral  Near:     Physical Exam Vitals reviewed.  Constitutional:      General: She is not in acute distress.    Appearance: She is not ill-appearing, toxic-appearing or diaphoretic.  HENT:     Right Ear: Tympanic membrane and ear canal normal.     Left Ear: Tympanic membrane and ear canal normal.     Nose: Congestion present.     Mouth/Throat:     Mouth: Mucous membranes are moist.     Comments: No erythema but there is clear mucus draining Eyes:     Extraocular Movements: Extraocular movements intact.     Conjunctiva/sclera: Conjunctivae normal.     Pupils: Pupils are equal, round, and reactive to light.  Cardiovascular:     Rate and Rhythm: Normal rate and regular rhythm.     Heart sounds: No murmur heard. Pulmonary:     Effort: No respiratory distress.     Breath sounds: No stridor. No wheezing, rhonchi or rales.  Musculoskeletal:     Cervical back: Neck supple.  Lymphadenopathy:     Cervical: No cervical adenopathy.  Skin:    Capillary Refill: Capillary refill takes less than 2 seconds.     Coloration: Skin is not jaundiced or pale.  Neurological:     General: No focal deficit present.     Mental Status: She is alert and oriented to person, place, and time.  Psychiatric:        Behavior: Behavior normal.      UC Treatments / Results  Labs (all labs ordered are listed, but only abnormal results are displayed) Labs Reviewed - No data to display  EKG   Radiology No results found.  Procedures Procedures (including critical care time)  Medications Ordered in UC Medications - No data to display  Initial Impression / Assessment and Plan / UC Course  I have reviewed the triage vital signs  and the nursing notes.  Pertinent labs & imaging results that were available during my care of the patient were reviewed by me and considered in my medical decision making (see chart for details).        We discussed that she is past the time her COVID testing would be  helpful.  Carole Civil is sent in to treat probable sinusitis.  Tessalon Perles are sent in for the cough Final Clinical Impressions(s) / UC Diagnoses   Final diagnoses:  Acute maxillary sinusitis, recurrence not specified  Viral URI     Discharge Instructions      Take cefdinir 300 mg--2 capsules together daily for 7 days  Take benzonatate 100 mg, 1 tab every 8 hours as needed for cough.       ED Prescriptions     Medication Sig Dispense Auth. Provider   cefdinir (OMNICEF) 300 MG capsule Take 2 capsules (600 mg total) by mouth daily for 7 days. 14 capsule Eino Whitner, Gwenlyn Perking, MD   benzonatate (TESSALON) 100 MG capsule Take 1 capsule (100 mg total) by mouth 3 (three) times daily as needed for cough. 21 capsule Barrett Henle, MD      PDMP not reviewed this encounter.   Barrett Henle, MD 09/02/22 1254

## 2022-09-05 ENCOUNTER — Telehealth: Payer: Self-pay | Admitting: Registered Nurse

## 2022-09-05 NOTE — Telephone Encounter (Signed)
Received message from patient was seen at urgent care and given work note for 2/03 Sep 2022.  RTW 04 Sep 2022.  Patient diagnosed with sinusitis and started on omnicef and given tessalon pearles.  No covid testing performed as symptoms started Christmas and would not change provider treatment.  Message left for patient work note forwarded to [employer] in [place] and I do recommend home covid testing to see if she does have covid especially if she lives with others.  Patient notified I can be reached via email this weekend if questions or concerns as clinic closed until 08 Sep 798 when RN Evlyn Kanner returns.

## 2022-10-24 ENCOUNTER — Telehealth: Payer: Self-pay | Admitting: Registered Nurse

## 2022-10-24 ENCOUNTER — Encounter: Payer: Self-pay | Admitting: Registered Nurse

## 2022-10-24 DIAGNOSIS — E559 Vitamin D deficiency, unspecified: Secondary | ICD-10-CM

## 2022-10-24 DIAGNOSIS — Z0000 Encounter for general adult medical examination without abnormal findings: Secondary | ICD-10-CM

## 2022-10-24 NOTE — Telephone Encounter (Signed)
Appt scheduled 10/30/22 orders placed B6 history elevated on epic review, female executive panel, hgba1c and vitamin D (no history of being checked)

## 2022-10-30 ENCOUNTER — Other Ambulatory Visit: Payer: Self-pay | Admitting: Occupational Medicine

## 2022-10-30 DIAGNOSIS — Z0000 Encounter for general adult medical examination without abnormal findings: Secondary | ICD-10-CM

## 2022-10-30 NOTE — Progress Notes (Signed)
Lab drawn tolerated well no issues noted.

## 2022-10-31 LAB — CMP12+LP+TP+TSH+6AC+CBC/D/PLT: Immature Grans (Abs): 0 10*3/uL (ref 0.0–0.1)

## 2022-11-01 ENCOUNTER — Encounter: Payer: Self-pay | Admitting: Registered Nurse

## 2022-11-01 MED ORDER — CHOLECALCIFEROL 1.25 MG (50000 UT) PO TABS: 1.0000 | ORAL_TABLET | ORAL | 1 refills | Status: AC

## 2022-11-01 NOTE — Progress Notes (Signed)
My chart sent to patient Megan, [name] met requirements for Be Well discount 2025 blood pressure less than 135/85 and Hgba1c less than 7.  See RN Evlyn Kanner to sign your paperwork.  Not all your lab results are back.  B6 was not drawn and since you have history of low in the past will check with next lab draw.  See RN Evlyn Kanner if you want printed copy of results or results sent to another provider.  Follow up with PCM elevated low vitamin D. I recommend 15 minutes sunlight on skin daily and start 50,000 units Vitamin D once a week by mouth with meal rx sent to your pharmacy and repeat level nonfasting in 6 months.I recommend exercise 150 minutes per week; dietary fiber 20 grams women per up to date; eat whole grains/fruits/vegetables; keep added sugars to less than 100 calories/ 5 teaspoons for women  per American Heart Association;  blood sugar and blood count normal  Please let us know if you have further questions or concerns.  Sincerely, Gerarda Fraction NP-C

## 2022-11-01 NOTE — Addendum Note (Signed)
Addended by: Gerarda Fraction A on: 11/01/2022 11:33 AM   Modules accepted: Orders

## 2022-11-02 LAB — CMP12+LP+TP+TSH+6AC+CBC/D/PLT
ALT: 13 IU/L (ref 0–32)
AST: 17 IU/L (ref 0–40)
Albumin/Globulin Ratio: 2 (ref 1.2–2.2)
Albumin: 4.8 g/dL (ref 3.9–4.9)
Alkaline Phosphatase: 51 IU/L (ref 44–121)
BUN/Creatinine Ratio: 18 (ref 9–23)
BUN: 14 mg/dL (ref 6–20)
Basophils Absolute: 0 10*3/uL (ref 0.0–0.2)
Basos: 1 %
Bilirubin Total: 0.5 mg/dL (ref 0.0–1.2)
Calcium: 9.6 mg/dL (ref 8.7–10.2)
Chloride: 100 mmol/L (ref 96–106)
Chol/HDL Ratio: 2 ratio (ref 0.0–4.4)
Cholesterol, Total: 191 mg/dL (ref 100–199)
Creatinine, Ser: 0.79 mg/dL (ref 0.57–1.00)
EOS (ABSOLUTE): 0.1 10*3/uL (ref 0.0–0.4)
Eos: 1 %
Estimated CHD Risk: <0.5 times avg. (ref 0.0–1.0)
Free Thyroxine Index: 1.9 (ref 1.2–4.9)
GGT: 22 IU/L (ref 0–60)
Globulin, Total: 2.4 g/dL (ref 1.5–4.5)
Glucose: 83 mg/dL (ref 70–99)
HDL: 94 mg/dL (ref >39)
Hematocrit: 40.6 % (ref 34.0–46.6)
Hemoglobin: 13.4 g/dL (ref 11.1–15.9)
Immature Grans (Abs): 0 10*3/uL (ref 0.0–0.1)
Immature Granulocytes: 0 %
Iron: 103 ug/dL (ref 27–159)
LDH: 128 IU/L (ref 119–226)
LDL Chol Calc (NIH): 83 mg/dL (ref 0–99)
Lymphocytes Absolute: 1.5 10*3/uL (ref 0.7–3.1)
Lymphs: 25 %
MCH: 30.4 pg (ref 26.6–33.0)
MCHC: 33 g/dL (ref 31.5–35.7)
MCV: 92 fL (ref 79–97)
Monocytes Absolute: 0.5 10*3/uL (ref 0.1–0.9)
Monocytes: 9 %
Neutrophils Absolute: 3.9 10*3/uL (ref 1.4–7.0)
Neutrophils: 64 %
Phosphorus: 3.9 mg/dL (ref 3.0–4.3)
Platelets: 216 10*3/uL (ref 150–450)
Potassium: 4.7 mmol/L (ref 3.5–5.2)
RBC: 4.41 x10E6/uL (ref 3.77–5.28)
RDW: 12.6 % (ref 11.7–15.4)
Sodium: 138 mmol/L (ref 134–144)
T3 Uptake Ratio: 28 % (ref 24–39)
T4, Total: 6.7 ug/dL (ref 4.5–12.0)
TSH: 2.1 u[IU]/mL (ref 0.450–4.500)
Total Protein: 7.2 g/dL (ref 6.0–8.5)
Triglycerides: 80 mg/dL (ref 0–149)
Uric Acid: 4.7 mg/dL (ref 2.6–6.2)
VLDL Cholesterol Cal: 14 mg/dL (ref 5–40)
WBC: 6 10*3/uL (ref 3.4–10.8)
eGFR: 102 mL/min/{1.73_m2} (ref >59)

## 2022-11-02 LAB — HEMOGLOBIN A1C
Est. average glucose Bld gHb Est-mCnc: 97 mg/dL
Hgb A1c MFr Bld: 5 % (ref 4.8–5.6)

## 2022-11-02 LAB — VITAMIN B6

## 2022-11-02 LAB — VITAMIN D 25 HYDROXY (VIT D DEFICIENCY, FRACTURES): Vit D, 25-Hydroxy: 14.3 ng/mL — ABNORMAL LOW (ref 30.0–100.0)

## 2022-11-10 ENCOUNTER — Ambulatory Visit: Payer: Self-pay | Admitting: Occupational Medicine

## 2022-11-10 VITALS — BP 112/66 | Ht 65.0 in | Wt 133.0 lb

## 2022-11-10 DIAGNOSIS — Z0000 Encounter for general adult medical examination without abnormal findings: Secondary | ICD-10-CM

## 2022-11-10 DIAGNOSIS — E559 Vitamin D deficiency, unspecified: Secondary | ICD-10-CM

## 2022-11-11 NOTE — Progress Notes (Signed)
B6 was not drawn and since you have history of low in the past will check with next lab draw.  See RN Evlyn Kanner if you want printed copy of results or results sent to another provider.  Follow up with PCM elevated low vitamin D. I recommend 15 minutes sunlight on skin daily and start 50,000 units Vitamin D once a week by mouth with meal rx sent to your pharmacy and repeat level nonfasting in 6 months.I recommend exercise 150 minutes per week; dietary fiber 20 grams women per up to date; eat whole grains/fruits/vegetables; keep added sugars to less than 100 calories/ 5 teaspoons for women  per American Heart Association;  blood sugar and blood count normal  Please let us know if you have further questions or concerns.     Be well insurance premium discount evaluation:   Epic reviewed by RN Evlyn Kanner and transcribed labs.  Tobacco attestation signed. [facility] ROI formed signed. Forms placed in the chart.   Patient given handouts for [facility] pharmacies and discount drugs list,MyChart, Tele doc setup, Tele doc Behavioral, [place] counseling and [facility] counseling.  What to do for infectious illness protocol. Given handout for list of medications that can be filled at [facility]. Given Clinic hours and Clinic Email.   Scheduled follow up labs.

## 2022-11-11 NOTE — Telephone Encounter (Signed)
Patient seen in workcenter symptoms resolved feeling well Feb 2024 and Be Well 2025 appt completed end of February.

## 2023-03-29 NOTE — Progress Notes (Signed)
Patient read results/results note per epic read receipt 11/02/22 saw RN Bess Kinds 11/10/22 scheduled follow up lab appt and given printed copy results/results note.  Be Well signed provider paperwork 11/11/22 RN Kirmey notified HR team met 2025 FY discount requirements LDL 83 A1c 5 BP 129/79  Megan [name],  Your vitamin D follow up level on 05/06/23 has been cancelled due to new national guidelines.  [facility] no longer drawing vitamin D levels.  B6 level still to be drawn that day nonfasting.  Please let us know if you have further questions.  Albina Billet NP-C

## 2023-05-06 ENCOUNTER — Other Ambulatory Visit: Payer: Self-pay
